# Patient Record
Sex: Male | Born: 1955 | Race: White | Hispanic: No | Smoking: Current every day smoker
Health system: Southern US, Community
[De-identification: ages and names within clinical notes are randomized; demographics above are authoritative.]

## PROBLEM LIST (undated history)

## (undated) DIAGNOSIS — E785 Hyperlipidemia, unspecified: Secondary | ICD-10-CM

## (undated) DIAGNOSIS — K219 Gastro-esophageal reflux disease without esophagitis: Secondary | ICD-10-CM

## (undated) DIAGNOSIS — M1990 Unspecified osteoarthritis, unspecified site: Secondary | ICD-10-CM

## (undated) DIAGNOSIS — Z720 Tobacco use: Secondary | ICD-10-CM

## (undated) DIAGNOSIS — E669 Obesity, unspecified: Secondary | ICD-10-CM

## (undated) DIAGNOSIS — N529 Male erectile dysfunction, unspecified: Secondary | ICD-10-CM

## (undated) HISTORY — DX: Obesity, unspecified: E66.9

## (undated) HISTORY — DX: Hyperlipidemia, unspecified: E78.5

## (undated) HISTORY — DX: Male erectile dysfunction, unspecified: N52.9

## (undated) HISTORY — DX: Tobacco use: Z72.0

---

## 2007-06-21 HISTORY — PX: CHOLECYSTECTOMY: SHX55

## 2007-07-10 ENCOUNTER — Encounter: Admission: RE | Admit: 2007-07-10 | Discharge: 2007-07-10 | Payer: Self-pay | Admitting: Family Medicine

## 2007-08-10 ENCOUNTER — Encounter (INDEPENDENT_AMBULATORY_CARE_PROVIDER_SITE_OTHER): Payer: Self-pay | Admitting: General Surgery

## 2007-08-10 ENCOUNTER — Ambulatory Visit (HOSPITAL_COMMUNITY): Admission: RE | Admit: 2007-08-10 | Discharge: 2007-08-10 | Payer: Self-pay | Admitting: General Surgery

## 2008-03-27 ENCOUNTER — Encounter: Payer: Self-pay | Admitting: Family Medicine

## 2008-12-31 ENCOUNTER — Telehealth: Payer: Self-pay | Admitting: Family Medicine

## 2009-01-08 ENCOUNTER — Ambulatory Visit: Payer: Self-pay | Admitting: Family Medicine

## 2009-01-08 DIAGNOSIS — G4700 Insomnia, unspecified: Secondary | ICD-10-CM | POA: Insufficient documentation

## 2009-02-26 ENCOUNTER — Telehealth: Payer: Self-pay | Admitting: Family Medicine

## 2009-07-05 IMAGING — US US ABDOMEN COMPLETE
1 series · 14 of 25 positions shown · non-contrast
Comparison: none

CLINICAL DATA: Weight loss, abdominal pain. 
 COMPLETE ABDOMINAL ULTRASOUND:
TECHNIQUE: Complete abdominal ultrasound examination was performed including evaluation of the liver, gallbladder, bile ducts, pancreas, kidneys, spleen, IVC, and abdominal aorta.

[Series 1: us abdomen complete · 0.35mm/px · 14 of 71 slices shown]
[im 1/71]
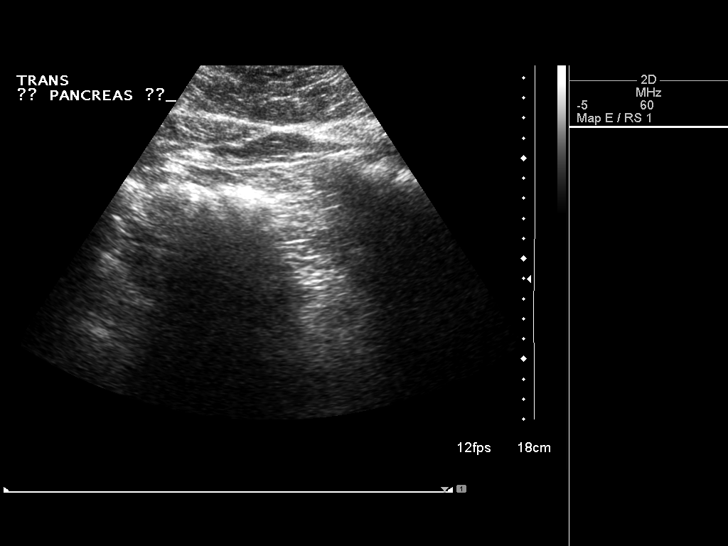
[im 6/71]
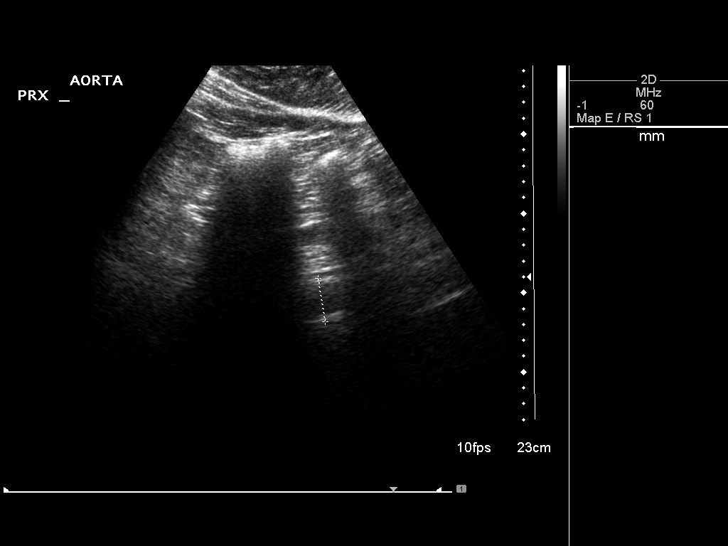
[im 12/71]
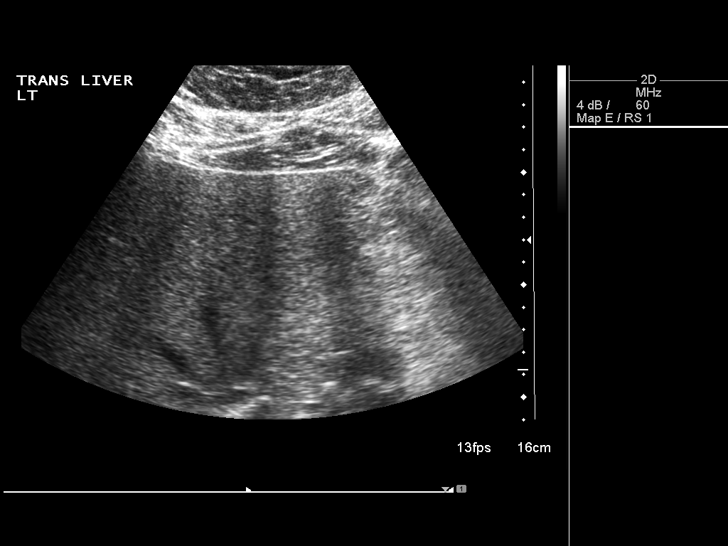
[im 18/71]
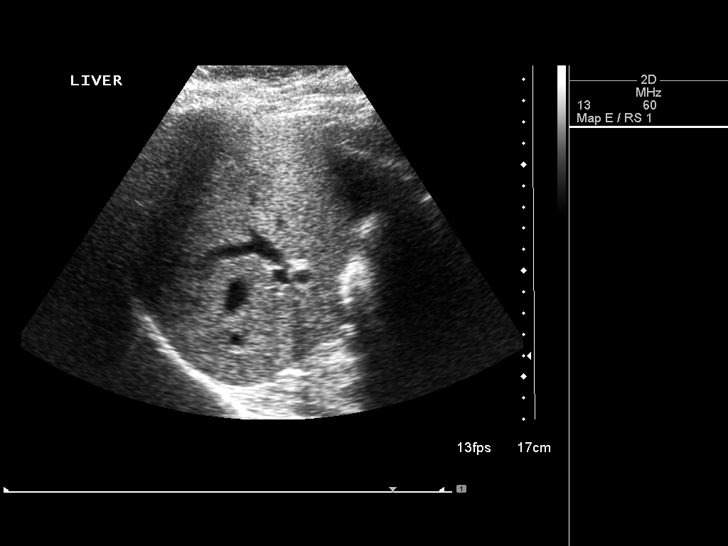
[im 24/71]
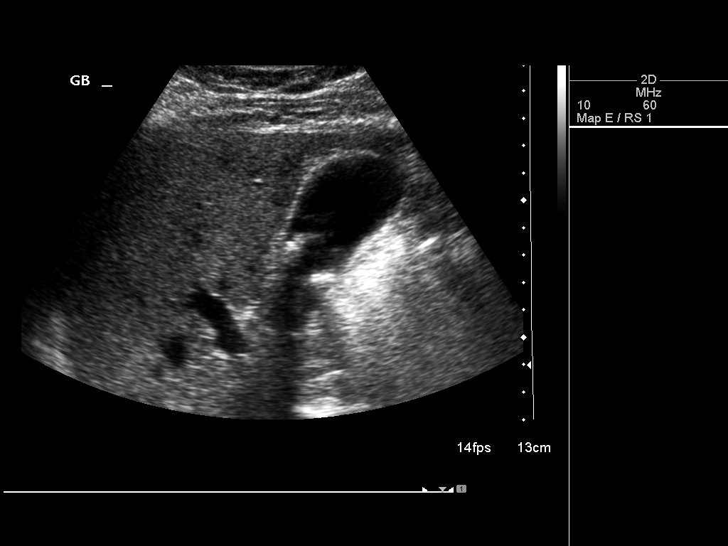
[im 27/71]
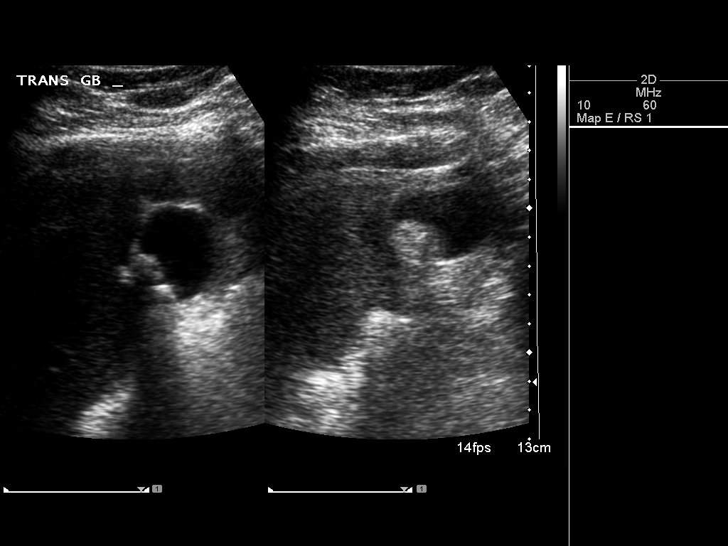
[im 33/71]
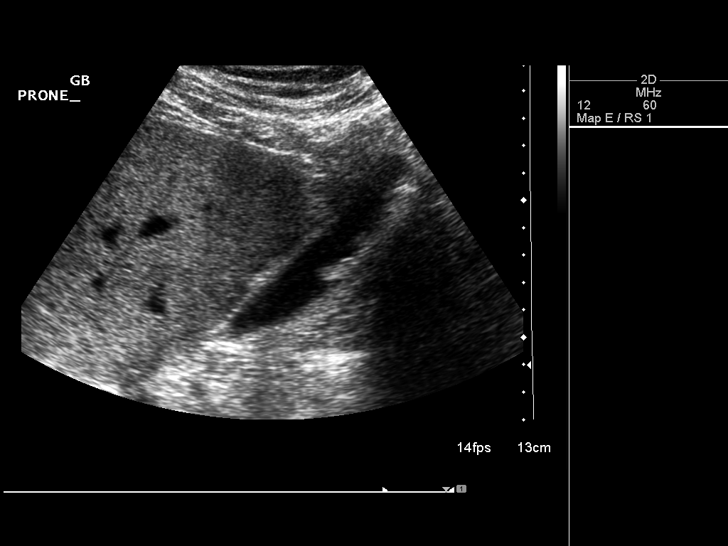
[im 38/71]
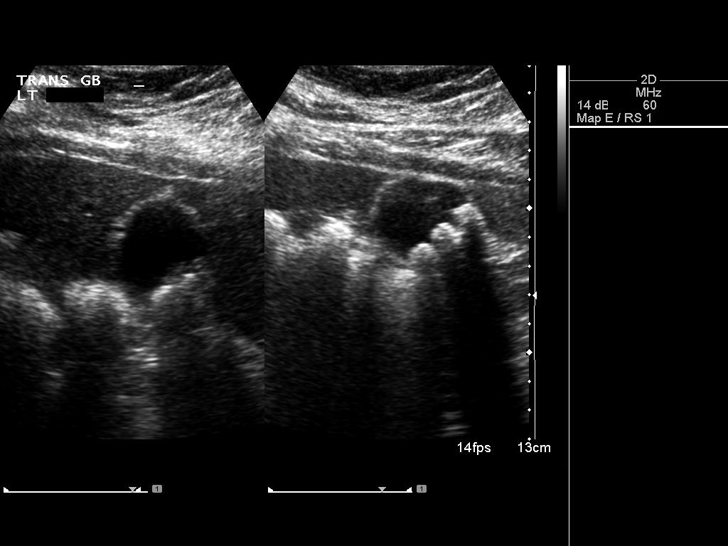
[im 44/71]
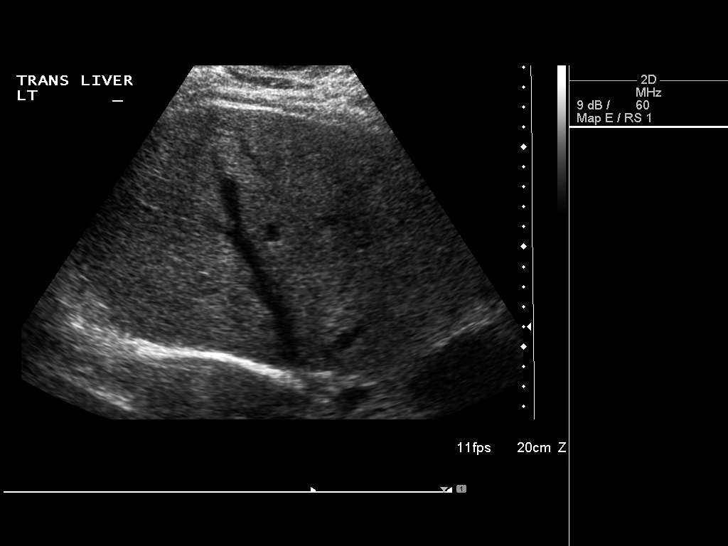
[im 47/71]
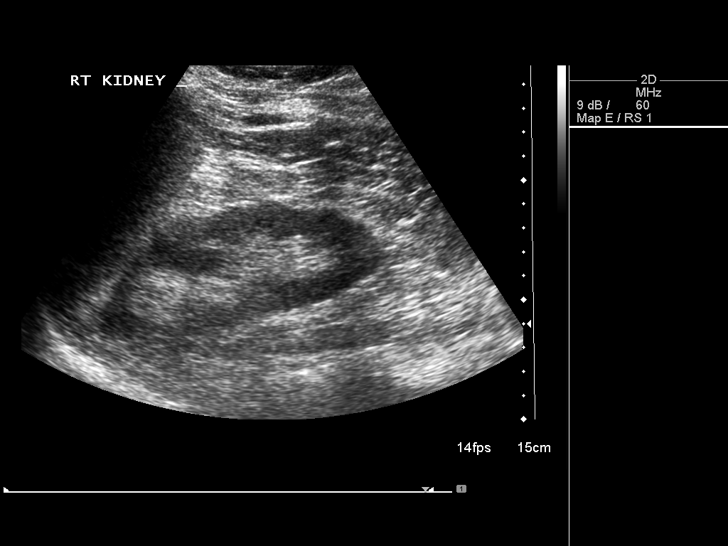
[im 53/71]
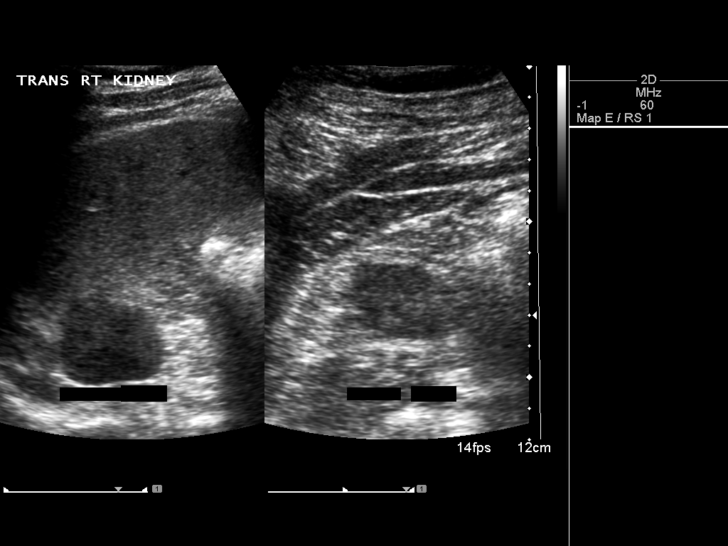
[im 59/71]
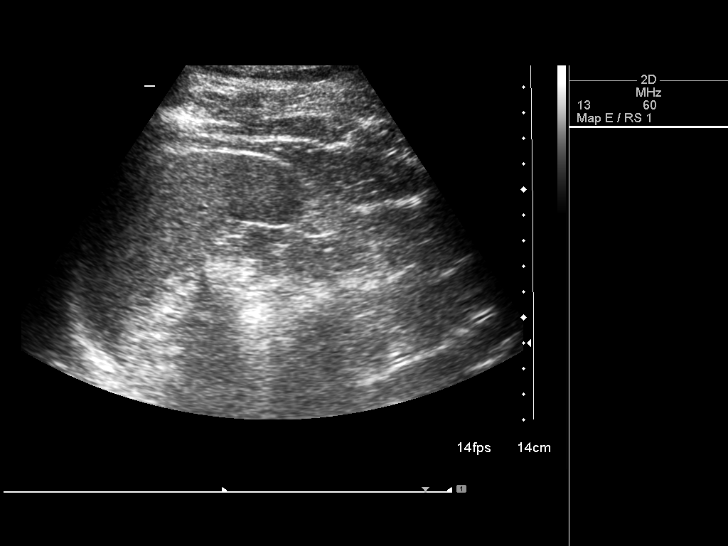
[im 65/71]
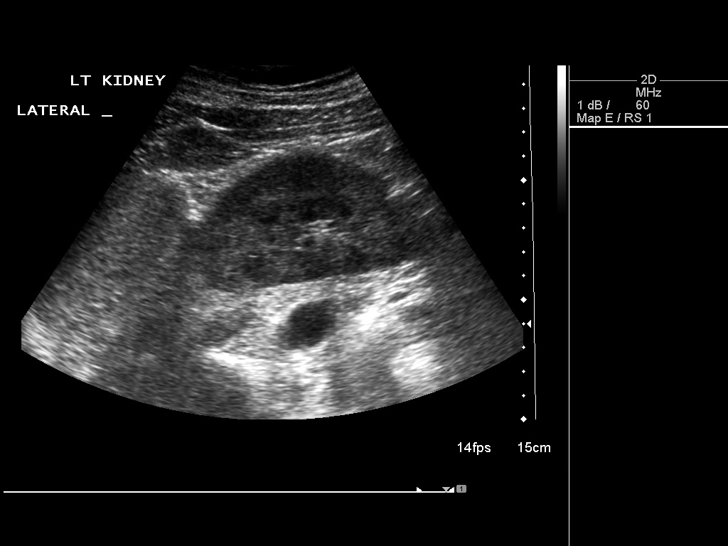
[im 71/71]
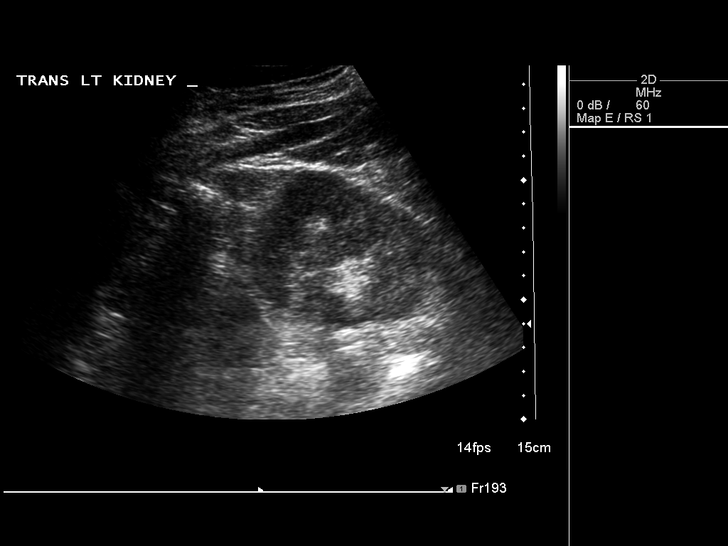

[14 of 25 positions shown; findings below may reference images not displayed]

FINDINGS: Multiple echo densities are present within the gallbladder with shadowing consistent with multiple mobile gallstones.  The liver is slightly echogenic suggesting fatty infiltration.  The common bile duct is normal measuring 3.6mm.  IVC and pancreas are largely obscured by bowel gas.  The spleen is normal in size.  No hydronephrosis is noted.  The right kidney measures 12.1cm sagittally with the left kidney measuring 11.5cm.  The abdominal aorta is normal in caliber.
IMPRESSION: 1.  Multiple gallstones.  No gallbladder wall thickening or ductal dilatation.  
 2.  Fatty infiltration of the liver.  
 3.  Pancreas obscured by bowel gas.

## 2009-08-05 IMAGING — RF DG CHOLANGIOGRAM OPERATIVE
1 series · 4 of 4 positions shown · non-contrast
Comparison: None.

CLINICAL DATA: Cholecystectomy.
 INTRAOPERATIVE CHOLANGIOGRAM:

[Series 1: run · 4 of 76 frames shown]
[frame 12/76]
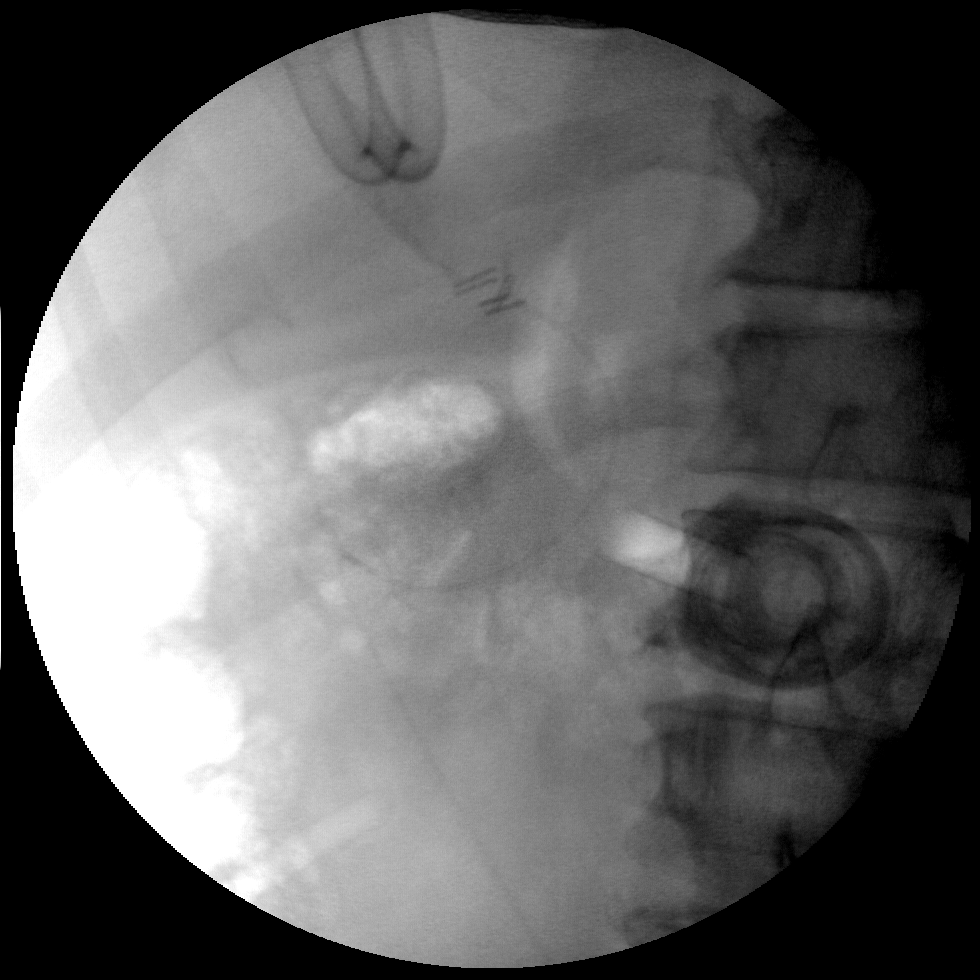
[frame 16/76]
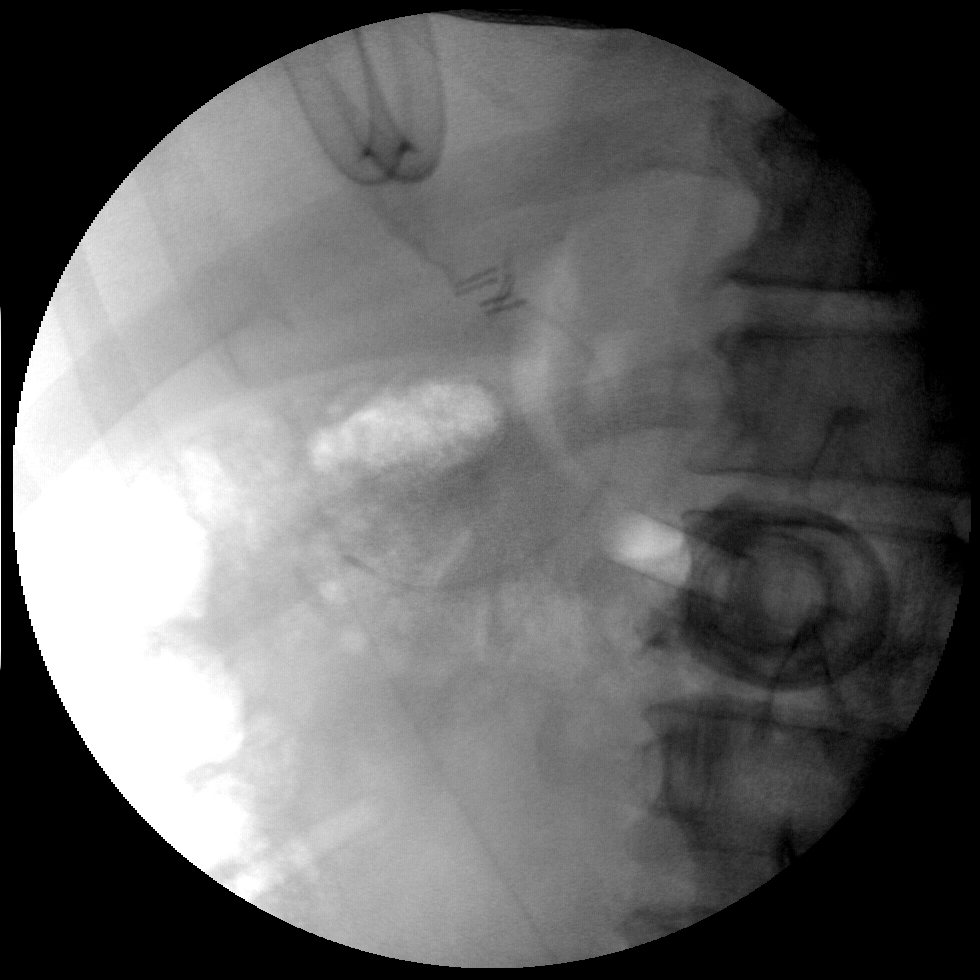
[frame 39/76]
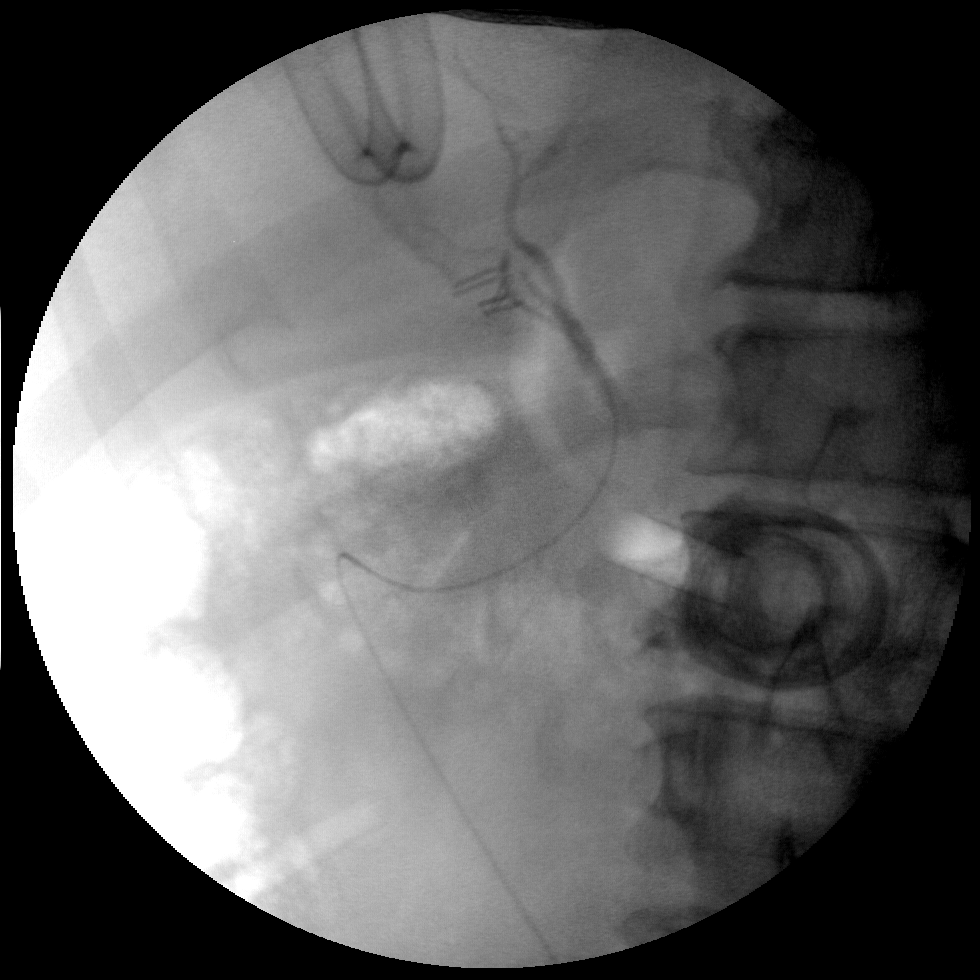
[frame 65/76]
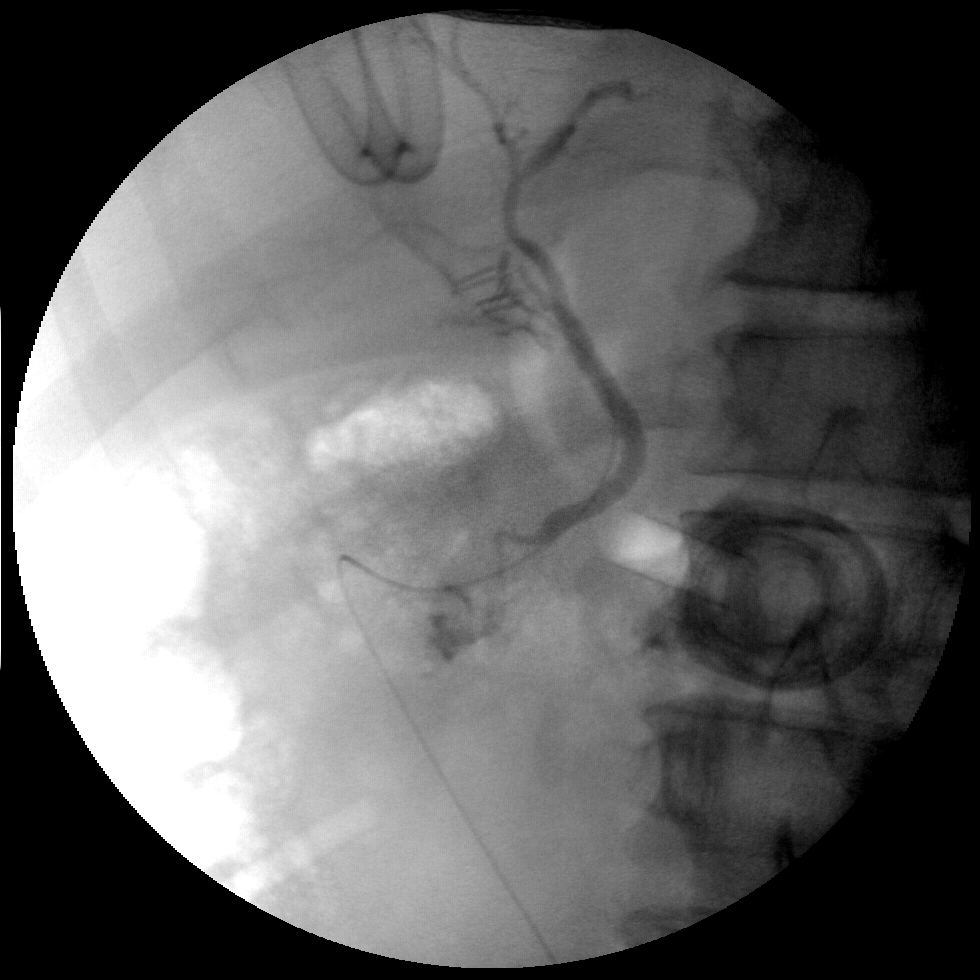

[4 of 4 positions shown; findings below may reference images not displayed]

FINDINGS: We are provided with a series of fluoroscopic intraoperative spot views from cholangiogram.  Clips are present in the region of the cystic duct.  Injection of the cystic duct demonstrates normal caliber common bile duct and visualized hepatic ducts.  No filling defect or stricture is identified.  Contrast flows into the duodenum.  No leak.
IMPRESSION: Normal intraoperative cholangiogram.

## 2009-08-21 ENCOUNTER — Ambulatory Visit: Payer: Self-pay | Admitting: Family Medicine

## 2009-08-21 DIAGNOSIS — H6690 Otitis media, unspecified, unspecified ear: Secondary | ICD-10-CM | POA: Insufficient documentation

## 2009-08-21 DIAGNOSIS — J019 Acute sinusitis, unspecified: Secondary | ICD-10-CM | POA: Insufficient documentation

## 2009-08-24 ENCOUNTER — Ambulatory Visit: Payer: Self-pay | Admitting: Family Medicine

## 2009-09-22 ENCOUNTER — Ambulatory Visit: Payer: Self-pay | Admitting: Family Medicine

## 2009-09-22 DIAGNOSIS — S93409A Sprain of unspecified ligament of unspecified ankle, initial encounter: Secondary | ICD-10-CM | POA: Insufficient documentation

## 2010-07-11 ENCOUNTER — Encounter: Payer: Self-pay | Admitting: General Surgery

## 2010-07-20 NOTE — Assessment & Plan Note (Signed)
Summary: ? virus//ccm   Vital Signs:  Patient profile:   55 year old male Temp:     98.8 degrees F BP sitting:   130 / 84  History of Present Illness: Acute visit. Recent otitis media and acute sinusitis. Start amoxicillin last week. Tolerating well. Now has some nausea and vomiting the past few days. No diarrhea. Denies any abdominal pain or fever. Still has some sinus congestive symptoms and intermittent headache. poor appetite.  Allergies: No Known Drug Allergies  Past History:  Past Medical History: Last updated: 01/08/2009 Obesity Dyslipidemia Ongoing nicotine use Erectile dysfunction PMH reviewed for relevance  Review of Systems      See HPI  Physical Exam  General:  Well-developed,well-nourished,in no acute distress; alert,appropriate and cooperative throughout examination Ears:  right eardrum reveals minimal erythema but overall this looks significantly improved compared to last week. No bulging of eardrums. Ear canals normal Nose:  External nasal examination shows no deformity or inflammation. Nasal mucosa are pink and moist without lesions or exudates. Mouth:  Oral mucosa and oropharynx without lesions or exudates.  Teeth in good repair. Neck:  No deformities, masses, or tenderness noted. Lungs:  Normal respiratory effort, chest expands symmetrically. Lungs are clear to auscultation, no crackles or wheezes. Heart:  Normal rate and regular rhythm. S1 and S2 normal without gallop, murmur, click, rub or other extra sounds. Abdomen:  Bowel sounds positive,abdomen soft and non-tender without masses, organomegaly or hernias noted.   Impression & Recommendations:  Problem # 1:  NAUSEA AND VOMITING (ICD-787.01) ?sinus related.  Doubt sec to antibiotic.  Promethazine prescribed to use as needed.  Problem # 2:  OTITIS MEDIA, ACUTE (ICD-382.9) Assessment: Improved  His updated medication list for this problem includes:    Amoxicillin 875 Mg Tabs (Amoxicillin) ..... One  by mouth two times a day for 10 days  Complete Medication List: 1)  Viagra 100 Mg Tabs (Sildenafil citrate) .... 1/2 to 1 tab by mouth as needed 2)  Amoxicillin 875 Mg Tabs (Amoxicillin) .... One by mouth two times a day for 10 days 3)  Alprazolam 0.5 Mg Tabs (Alprazolam) .... One by mouth at bedtime as needed insomnia 4)  Promethazine Hcl 25 Mg Tabs (Promethazine hcl) .... One by mouth q 6 hours as needed nausea and vomiting.  Patient Instructions: 1)  Touch base by end of week if sinus symptoms are not improving. Prescriptions: PROMETHAZINE HCL 25 MG TABS (PROMETHAZINE HCL) one by mouth q 6 hours as needed nausea and vomiting.  #8 x 0   Entered and Authorized by:   Evelena Peat MD   Signed by:   Evelena Peat MD on 08/24/2009   Method used:   [facility]        [facility]  [place] [address]       [address]       [place], [place]  [address]       Ph: [phone] or [phone]       Fax: [phone]   RxID:   [id number]

## 2010-07-20 NOTE — Assessment & Plan Note (Signed)
Summary: leg swelling/njr   Vital Signs:  Patient profile:   55 year old male Weight:      250 pounds Temp:     98 degrees F oral BP sitting:   130 / 92  (left arm) Cuff size:   large  Vitals Entered By: Sid Falcon LPN (September 23, 5407 9:50 AM) CC: left ankle injury 10 days ago   History of Present Illness: Patient seen with left ankle injury which occurred about one half weeks ago. Stepped on some rocks coming off stairs and rolled his ankle with presumed inversion type injury. Immediate swelling. Difficult initially bearing weight. Soreness is resolving over time. Improvement with Aleve and icing. Still has persistent edema. Still some pain with prolonged periods of standing. Is using a brace and Ace wrap intermittently  Allergies (verified): No Known Drug Allergies  Past History:  Past Medical History: Last updated: 01/08/2009 Obesity Dyslipidemia Ongoing nicotine use Erectile dysfunction PMH reviewed for relevance  Physical Exam  General:  Well-developed,well-nourished,in no acute distress; alert,appropriate and cooperative throughout examination Mouth:  no dental plaque.   Extremities:  left ankle reveals effusion and swelling predominantly lateral ankle and anterior with minimal edema medially. No visible ecchymosis. No significant localized bony tenderness. Limited eversion and inversion secondary to edema. Achilles Tendon is intact and nontender. Good strength with plantar flexion /dorsiflexion.   Impression & Recommendations:  Problem # 1:  ANKLE SPRAIN (ICD-845.00) given persistent edema X-ray to further evaluate. Orders: T-Ankle Comp Left Min 3 Views (73610TC)  Complete Medication List: 1)  Viagra 100 Mg Tabs (Sildenafil citrate) .... 1/2 to 1 tab by mouth as needed 2)  Alprazolam 0.5 Mg Tabs (Alprazolam) .... One by mouth at bedtime as needed insomnia 3)  Promethazine Hcl 25 Mg Tabs (Promethazine hcl) .... One by mouth q 6 hours as needed nausea and  vomiting.  Patient Instructions: 1)  Continue frequent elevation, icing, Ace wrap, and anti-inflammatory such as ibuprofen or Aleve

## 2010-07-20 NOTE — Assessment & Plan Note (Signed)
Summary: sinuses//ccm   Vital Signs:  Patient profile:   55 year old male Temp:     99.0 degrees F oral BP sitting:   130 / 88  (left arm) Cuff size:   large  Vitals Entered By: Sid Falcon LPN (August 21, 1608 8:54 AM) CC: Sinusus, right ear pain   History of Present Illness: Acute visit. URI symptoms last week. Now has right ear pain past couple days. Headaches off and on. No fever. Right maxillary facial pain. Occasional yellowish nasal discharge. No known drug allergies.  Patient also complaining of some chronic intermittent insomnia issues. Has previously used very low dose alprazolam which seemed to help. Sleep hygiene issues discussed.  Allergies (verified): No Known Drug Allergies  Past History:  Past Medical History: Last updated: 01/08/2009 Obesity Dyslipidemia Ongoing nicotine use Erectile dysfunction  Past Surgical History: Last updated: 01/08/2009 Cholecystectomy 2009  Social History: Last updated: 01/08/2009 Occupation:  Art gallery manager Divorced Current Smoker Alcohol use-yes Regular exercise-no PMH-FH-SH reviewed for relevance  Review of Systems      See HPI  Physical Exam  Ears:  right tympanic membrane bulging with distorted landmarks. Left TM is normal Nose:  yellow thick crusted drainage right naris Mouth:  Oral mucosa and oropharynx without lesions or exudates.  Teeth in good repair. Neck:  No deformities, masses, or tenderness noted. Lungs:  Normal respiratory effort, chest expands symmetrically. Lungs are clear to auscultation, no crackles or wheezes. Heart:  Normal rate and regular rhythm. S1 and S2 normal without gallop, murmur, click, rub or other extra sounds.   Impression & Recommendations:  Problem # 1:  OTITIS MEDIA, ACUTE (ICD-382.9)  His updated medication list for this problem includes:    Amoxicillin 875 Mg Tabs (Amoxicillin) ..... One by mouth two times a day for 10 days  Problem # 2:  INSOMNIA, TRANSIENT  (ICD-780.52) prescribed low-dose alprazolam to use rarely as needed  Complete Medication List: 1)  Viagra 100 Mg Tabs (Sildenafil citrate) .... 1/2 to 1 tab by mouth as needed 2)  Amoxicillin 875 Mg Tabs (Amoxicillin) .... One by mouth two times a day for 10 days 3)  Alprazolam 0.5 Mg Tabs (Alprazolam) .... One by mouth at bedtime as needed insomnia  Patient Instructions: 1)  call or touch base next week if symptoms not improving. Prescriptions: ALPRAZOLAM 0.5 MG TABS (ALPRAZOLAM) one by mouth at bedtime as needed insomnia  #30 x 1   Entered and Authorized by:   Evelena Peat MD   Signed by:   Evelena Peat MD on 08/21/2009   Method used:   Print then Give to Patient   RxID:   [id number] AMOXICILLIN 875 MG TABS (AMOXICILLIN) one by mouth two times a day for 10 days  #20 x 0   Entered and Authorized by:   Evelena Peat MD   Signed by:   Evelena Peat MD on 08/21/2009   Method used:   [facility]        [facility]  [place] [address] [facility]       [address]       [place], [place]  [address]       Ph: [phone] or [phone]       Fax: [phone]   RxID:   [id number]

## 2010-08-09 ENCOUNTER — Other Ambulatory Visit: Payer: Self-pay | Admitting: *Deleted

## 2010-08-09 DIAGNOSIS — G4700 Insomnia, unspecified: Secondary | ICD-10-CM

## 2010-08-09 MED ORDER — ALPRAZOLAM 0.5 MG PO TABS: 0.5000 mg | ORAL_TABLET | Freq: Three times a day (TID) | ORAL | Status: DC | PRN

## 2010-08-09 NOTE — Telephone Encounter (Signed)
OK to refill once.   

## 2010-08-09 NOTE — Telephone Encounter (Signed)
Pharmacy requesting refill of Alprazolam.  Last filled 05-07-10 # 30 with 0 refills.  Take one tab at bedtime as needed for insomnia

## 2010-11-02 NOTE — Op Note (Signed)
NAMEALLEN, Keith Neal                 ACCOUNT NO.:  [id number]   MEDICAL RECORD NO.:  [id number]          PATIENT TYPE:  AMB   LOCATION:  DAY                          FACILITY:  [facility]   PHYSICIAN:  Adolph Pollack, M.D.DATE OF BIRTH:  May 09, 1956   DATE OF PROCEDURE:  08/10/2007  DATE OF DISCHARGE:                               OPERATIVE REPORT   PREOPERATIVE DIAGNOSIS:  Symptomatic cholelithiasis.   POSTOPERATIVE DIAGNOSIS:  Symptomatic cholelithiasis.   PROCEDURE:  Laparoscopic cholecystectomy with intraoperative  cholangiogram.   SURGEON:  Adolph Pollack, M.D.   ASSISTANT:  Wilmon Arms. Tsuei, M.D.   ANESTHESIA:  General.   INDICATIONS:  This 55 year old male has been having epigastric  discomfort and nausea and has known gallstones.  It is felt his symptoms  are consistent with biliary colic potentially.  Liver function tests are  within normal limits.  He is now brought to the operating room for  elective laparoscopic cholecystectomy.  We discussed the procedure risks  and aftercare preoperatively.   TECHNIQUE:  He was seen in the holding area and brought to the operating  room, placed supine on the operating table, and a general anesthetic was  administered.  The abdominal wall was sterilely prepped and draped.  Marcaine was infiltrated in the subumbilical region.  A subumbilical  incision was made through skin, subcutaneous tissue, midline fascia and  peritoneum entering the peritoneal cavity under direct vision.  A  pursestring suture of 0 Vicryl was placed around the fascial edges.  A  Hassan trocar was introduced into the peritoneal cavity and  pneumoperitoneum created by insufflation of CO2 gas.   Next, a laparoscope was introduced.  He was placed in reversed  Trendelenburg position with the right side tilted slightly up.  An 11 mm  trocar was placed through an epigastric incision and two 5 mm trocars  were placed in the right lateral abdomen.  The fundus of the  gallbladder  was grasped and omental adhesions were noted to the body of the  gallbladder.  The fundus was retracted toward the right shoulder.  The  omental adhesions were taken down bluntly.  The infundibulum was grasped  and mobilized using blunt dissection.  I then isolated the cystic duct  and created a window around it.  I also isolated the cystic artery and  created a window around it.  I then placed a clip at the cystic duct  gallbladder junction and made a small incision in the cystic duct.  Bile  was milked back in the cystic duct.  The cholangiocatheter was passed to  the anterior abdominal wall and placed into the cystic duct and a  cholangiogram was performed.   Under real time fluoroscopy, dilute contrast was injected into the  cystic duct which was of moderate length.  The common hepatic, right and  left hepatic, common bile ducts all filled promptly and contrast drained  into the duodenum promptly without obvious evidence of obstruction.  The  final report is pending the radiologist's interpretation.   The cholangiocatheter was removed, the cystic duct was then clipped  three times proximally and divided.  I then clipped the cystic artery  and divided it.  The gallbladder was dissected free from the liver using  electrocautery and placed in an Endopouch bag.  The gallbladder fossa  was irrigated.  Bleeding points were controlled with cautery.  It was  irrigated and inspected again and there was no bleeding or bile leak.  Irrigation fluid was evacuated.  The gallbladder was then removed in the  endobag through the subumbilical port.  The subumbilical fascial defect  was closed under laparoscopic vision by tightening up and tying down the  pursestring suture.  The remaining trocars were removed and  pneumoperitoneum was released.   The skin incisions were then closed with 4-0 Monocryl subcuticular  stitches followed by Steri-Strips and sterile dressings.  He tolerated   the procedure without apparent complications and was taken to recovery  in satisfactory condition.      Adolph Pollack, M.D.  Electronically Signed     TJR/MEDQ  D:  08/10/2007  T:  08/10/2007  Job:  [id number]   cc:   Evelena Peat, M.D.

## 2011-01-13 ENCOUNTER — Telehealth: Payer: Self-pay | Admitting: *Deleted

## 2011-01-13 DIAGNOSIS — G4700 Insomnia, unspecified: Secondary | ICD-10-CM

## 2011-01-13 NOTE — Telephone Encounter (Signed)
Faxed refill for Alprazolam 0.5.  Last filled 08/09/10 #30 with 0 refills Last OV 09-22-09, sore throat

## 2011-01-13 NOTE — Telephone Encounter (Signed)
Refill once OK. 

## 2011-01-14 MED ORDER — ALPRAZOLAM 0.5 MG PO TABS: 0.5000 mg | ORAL_TABLET | Freq: Three times a day (TID) | ORAL | Status: DC | PRN

## 2011-03-08 ENCOUNTER — Other Ambulatory Visit: Payer: Self-pay | Admitting: *Deleted

## 2011-03-08 DIAGNOSIS — G4700 Insomnia, unspecified: Secondary | ICD-10-CM

## 2011-03-08 NOTE — Telephone Encounter (Signed)
Alprazolam 0.5 refill request.  Last filled 7/27, #30 with 0 refills

## 2011-03-08 NOTE — Telephone Encounter (Signed)
Refill once.  Should not be taking regularly.  If significant anxiety symptoms ongoing recommend office follow up to discuss options.

## 2011-03-09 MED ORDER — ALPRAZOLAM 0.5 MG PO TABS: 0.5000 mg | ORAL_TABLET | Freq: Three times a day (TID) | ORAL | Status: DC | PRN

## 2011-03-09 NOTE — Telephone Encounter (Signed)
Pt given message on cell VM

## 2011-03-11 LAB — DIFFERENTIAL
Basophils Absolute: 0
Basophils Relative: 1
Eosinophils Absolute: 0.3
Eosinophils Relative: 3
Lymphocytes Relative: 39
Lymphs Abs: 3.1
Monocytes Absolute: 0.8
Monocytes Relative: 10
Neutro Abs: 3.8
Neutrophils Relative %: 47

## 2011-03-11 LAB — CBC
HCT: 42.2
Hemoglobin: 14.9
MCHC: 35.3
MCV: 91.5
Platelets: 263
RBC: 4.61
RDW: 12.6
WBC: 8

## 2011-03-11 LAB — COMPREHENSIVE METABOLIC PANEL
ALT: 20
AST: 17
Albumin: 4.1
Alkaline Phosphatase: 52
BUN: 11
CO2: 27
Calcium: 9.7
Chloride: 104
Creatinine, Ser: 1.03
GFR calc Af Amer: >60
GFR calc non Af Amer: >60
Glucose, Bld: 96
Potassium: 4.1
Sodium: 138
Total Bilirubin: 0.9
Total Protein: 7.5

## 2011-03-11 LAB — URINALYSIS, ROUTINE W REFLEX MICROSCOPIC
Bilirubin Urine: NEGATIVE
Glucose, UA: NEGATIVE
Hgb urine dipstick: NEGATIVE
Specific Gravity, Urine: 1.004 — ABNORMAL LOW
Urobilinogen, UA: 0.2

## 2011-03-11 LAB — PROTIME-INR: Prothrombin Time: 12.9

## 2011-04-28 ENCOUNTER — Encounter: Payer: Self-pay | Admitting: Family Medicine

## 2011-04-28 ENCOUNTER — Ambulatory Visit (INDEPENDENT_AMBULATORY_CARE_PROVIDER_SITE_OTHER): Payer: BC Managed Care – PPO | Admitting: Family Medicine

## 2011-04-28 VITALS — BP 130/82 | Temp 98.3°F | Wt 249.0 lb

## 2011-04-28 DIAGNOSIS — N529 Male erectile dysfunction, unspecified: Secondary | ICD-10-CM

## 2011-04-28 DIAGNOSIS — G4700 Insomnia, unspecified: Secondary | ICD-10-CM

## 2011-04-28 MED ORDER — ALPRAZOLAM 0.5 MG PO TABS: 0.5000 mg | ORAL_TABLET | Freq: Three times a day (TID) | ORAL | Status: AC | PRN
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
## 2011-04-28 NOTE — Patient Instructions (Addendum)
Consider complete physical at some point later this year. Cialis 5 mg may take one daily as needed.

## 2011-05-30 ENCOUNTER — Telehealth: Payer: Self-pay | Admitting: Family Medicine

## 2011-05-30 NOTE — Telephone Encounter (Signed)
Pt requesting refill on sildenafil (VIAGRA) 100 MG tablet

## 2011-06-09 ENCOUNTER — Telehealth: Payer: Self-pay | Admitting: Family Medicine

## 2011-06-09 MED ORDER — SILDENAFIL CITRATE 100 MG PO TABS: 100.0000 mg | ORAL_TABLET | Freq: Every day | ORAL | Status: DC | PRN

## 2011-06-09 NOTE — Telephone Encounter (Signed)
Refill Viagra 100mg  to [facility]. Thanks.

## 2011-06-30 ENCOUNTER — Other Ambulatory Visit: Payer: Self-pay | Admitting: Family Medicine

## 2011-07-14 ENCOUNTER — Telehealth: Payer: Self-pay | Admitting: Family Medicine

## 2011-07-14 NOTE — Telephone Encounter (Signed)
Refill Viagra 100mg  [facility]. Patient knows that it is too soon, but he stated that he is out of meds. He also stated that he does not care if his insurance will cover it or not, if it is too soon. Thanks.

## 2011-07-15 MED ORDER — SILDENAFIL CITRATE 100 MG PO TABS: 100.0000 mg | ORAL_TABLET | ORAL | Status: DC | PRN

## 2011-07-15 NOTE — Telephone Encounter (Signed)
Refill for one year 

## 2011-07-22 ENCOUNTER — Telehealth: Payer: Self-pay | Admitting: Family Medicine

## 2011-07-22 MED ORDER — SILDENAFIL CITRATE 100 MG PO TABS: 100.0000 mg | ORAL_TABLET | ORAL | Status: DC | PRN

## 2011-07-22 NOTE — Telephone Encounter (Signed)
Refill Viagra 100mg  [facility]. (self pay) thanks

## 2011-11-02 ENCOUNTER — Other Ambulatory Visit (INDEPENDENT_AMBULATORY_CARE_PROVIDER_SITE_OTHER): Payer: BC Managed Care – PPO

## 2011-11-02 DIAGNOSIS — Z0000 Encounter for general adult medical examination without abnormal findings: Secondary | ICD-10-CM

## 2011-11-02 LAB — POCT URINALYSIS DIPSTICK
Glucose, UA: NEGATIVE
Nitrite, UA: NEGATIVE
Protein, UA: NEGATIVE
Urobilinogen, UA: 0.2

## 2011-11-02 LAB — CBC WITH DIFFERENTIAL/PLATELET
Basophils Absolute: 0.1 10*3/uL (ref 0.0–0.1)
Eosinophils Absolute: 0.2 10*3/uL (ref 0.0–0.7)
HCT: 44 % (ref 39.0–52.0)
Lymphs Abs: 2.9 10*3/uL (ref 0.7–4.0)
MCHC: 33.6 g/dL (ref 30.0–36.0)
Monocytes Absolute: 0.9 10*3/uL (ref 0.1–1.0)
Monocytes Relative: 10.1 % (ref 3.0–12.0)
Platelets: 314 10*3/uL (ref 150.0–400.0)
RDW: 13.6 % (ref 11.5–14.6)

## 2011-11-02 LAB — TSH: TSH: 1.14 u[IU]/mL (ref 0.35–5.50)

## 2011-11-02 LAB — LIPID PANEL
LDL Cholesterol: 120 mg/dL — ABNORMAL HIGH (ref 0–99)
VLDL: 16 mg/dL (ref 0.0–40.0)

## 2011-11-02 LAB — HEPATIC FUNCTION PANEL
AST: 16 U/L (ref 0–37)
Total Bilirubin: 0.6 mg/dL (ref 0.3–1.2)

## 2011-11-02 LAB — BASIC METABOLIC PANEL
BUN: 12 mg/dL (ref 6–23)
GFR: 82.48 mL/min (ref >60.00)
Glucose, Bld: 107 mg/dL — ABNORMAL HIGH (ref 70–99)
Potassium: 4.5 mEq/L (ref 3.5–5.1)

## 2011-11-02 LAB — PSA: PSA: 3.59 ng/mL (ref 0.10–4.00)

## 2011-11-10 ENCOUNTER — Encounter: Payer: Self-pay | Admitting: Family Medicine

## 2011-11-10 ENCOUNTER — Ambulatory Visit (INDEPENDENT_AMBULATORY_CARE_PROVIDER_SITE_OTHER): Payer: BC Managed Care – PPO | Admitting: Family Medicine

## 2011-11-10 VITALS — BP 130/78 | HR 80 | Temp 98.1°F | Resp 12 | Ht 68.0 in | Wt 230.0 lb

## 2011-11-10 DIAGNOSIS — Z0000 Encounter for general adult medical examination without abnormal findings: Secondary | ICD-10-CM

## 2011-11-10 MED ORDER — TADALAFIL 20 MG PO TABS: 20.0000 mg | ORAL_TABLET | Freq: Every day | ORAL | Status: DC | PRN

## 2011-11-10 NOTE — Patient Instructions (Signed)
Smoking Cessation This document explains the best ways for you to quit smoking and new treatments to help. It lists new medicines that can double or triple your chances of quitting and quitting for good. It also considers ways to avoid relapses and concerns you may have about quitting, including weight gain. NICOTINE: A POWERFUL ADDICTION If you have tried to quit smoking, you know how hard it can be. It is hard because nicotine is a very addictive drug. For some people, it can be as addictive as heroin or cocaine. Usually, people make 2 or 3 tries, or more, before finally being able to quit. Each time you try to quit, you can learn about what helps and what hurts. Quitting takes hard work and a lot of effort, but you can quit smoking. QUITTING SMOKING IS ONE OF THE MOST IMPORTANT THINGS YOU WILL EVER DO.  You will live longer, feel better, and live better.   The impact on your body of quitting smoking is felt almost immediately:   Within 20 minutes, blood pressure decreases. Pulse returns to its normal level.   After 8 hours, carbon monoxide levels in the blood return to normal. Oxygen level increases.   After 24 hours, chance of heart attack starts to decrease. Breath, hair, and body stop smelling like smoke.   After 48 hours, damaged nerve endings begin to recover. Sense of taste and smell improve.   After 72 hours, the body is virtually free of nicotine. Bronchial tubes relax and breathing becomes easier.   After 2 to 12 weeks, lungs can hold more air. Exercise becomes easier and circulation improves.   Quitting will reduce your risk of having a heart attack, stroke, cancer, or lung disease:   After 1 year, the risk of coronary heart disease is cut in half.   After 5 years, the risk of stroke falls to the same as a nonsmoker.   After 10 years, the risk of lung cancer is cut in half and the risk of other cancers decreases significantly.   After 15 years, the risk of coronary heart  disease drops, usually to the level of a nonsmoker.   If you are pregnant, quitting smoking will improve your chances of having a healthy baby.   The people you live with, especially your children, will be healthier.   You will have extra money to spend on things other than cigarettes.  FIVE KEYS TO QUITTING Studies have shown that these 5 steps will help you quit smoking and quit for good. You have the best chances of quitting if you use them together: 1. Get ready.  2. Get support and encouragement.  3. Learn new skills and behaviors.  4. Get medicine to reduce your nicotine addiction and use it correctly.  5. Be prepared for relapse or difficult situations. Be determined to continue trying to quit, even if you do not succeed at first.  1. GET READY  Set a quit date.   Change your environment.   Get rid of ALL cigarettes, ashtrays, matches, and lighters in your home, car, and place of work.   Do not let people smoke in your home.   Review your past attempts to quit. Think about what worked and what did not.   Once you quit, do not smoke. NOT EVEN A PUFF!  2. GET SUPPORT AND ENCOURAGEMENT Studies have shown that you have a better chance of being successful if you have help. You can get support in many ways.  Tell   your family, friends, and coworkers that you are going to quit and need their support. Ask them not to smoke around you.   Talk to your caregivers (doctor, dentist, nurse, pharmacist, psychologist, and/or smoking counselor).   Get individual, group, or telephone counseling and support. The more counseling you have, the better your chances are of quitting. Programs are available at local hospitals and health centers. Call your local health department for information about programs in your area.   Spiritual beliefs and practices may help some smokers quit.   Quit meters are small computer programs online or downloadable that keep track of quit statistics, such as amount  of "quit-time," cigarettes not smoked, and money saved.   Many smokers find one or more of the many self-help books available useful in helping them quit and stay off tobacco.  3. LEARN NEW SKILLS AND BEHAVIORS  Try to distract yourself from urges to smoke. Talk to someone, go for a walk, or occupy your time with a task.   When you first try to quit, change your routine. Take a different route to work. Drink tea instead of coffee. Eat breakfast in a different place.   Do something to reduce your stress. Take a hot bath, exercise, or read a book.   Plan something enjoyable to do every day. Reward yourself for not smoking.   Explore interactive web-based programs that specialize in helping you quit.  4. GET MEDICINE AND USE IT CORRECTLY Medicines can help you stop smoking and decrease the urge to smoke. Combining medicine with the above behavioral methods and support can quadruple your chances of successfully quitting smoking. The [place] Food and Drug Administration (FDA) has approved 7 medicines to help you quit smoking. These medicines fall into 3 categories.  Nicotine replacement therapy (delivers nicotine to your body without the negative effects and risks of smoking):   Nicotine gum: Available over-the-counter.   Nicotine lozenges: Available over-the-counter.   Nicotine inhaler: Available by prescription.   Nicotine nasal spray: Available by prescription.   Nicotine skin patches (transdermal): Available by prescription and over-the-counter.   Antidepressant medicine (helps people abstain from smoking, but how this works is unknown):   Bupropion sustained-release (SR) tablets: Available by prescription.   Nicotinic receptor partial agonist (simulates the effect of nicotine in your brain):   Varenicline tartrate tablets: Available by prescription.   Ask your caregiver for advice about which medicines to use and how to use them. Carefully read the information on the package.    Everyone who is trying to quit may benefit from using a medicine. If you are pregnant or trying to become pregnant, nursing an infant, you are under age 18, or you smoke fewer than 10 cigarettes per day, talk to your caregiver before taking any nicotine replacement medicines.   You should stop using a nicotine replacement product and call your caregiver if you experience nausea, dizziness, weakness, vomiting, fast or irregular heartbeat, mouth problems with the lozenge or gum, or redness or swelling of the skin around the patch that does not go away.   Do not use any other product containing nicotine while using a nicotine replacement product.   Talk to your caregiver before using these products if you have diabetes, heart disease, asthma, stomach ulcers, you had a recent heart attack, you have high blood pressure that is not controlled with medicine, a history of irregular heartbeat, or you have been prescribed medicine to help you quit smoking.  5. BE PREPARED FOR RELAPSE OR   DIFFICULT SITUATIONS  Most relapses occur within the first 3 months after quitting. Do not be discouraged if you start smoking again. Remember, most people try several times before they finally quit.   You may have symptoms of withdrawal because your body is used to nicotine. You may crave cigarettes, be irritable, feel very hungry, cough often, get headaches, or have difficulty concentrating.   The withdrawal symptoms are only temporary. They are strongest when you first quit, but they will go away within 10 to 14 days.  Here are some difficult situations to watch for:  Alcohol. Avoid drinking alcohol. Drinking lowers your chances of successfully quitting.   Caffeine. Try to reduce the amount of caffeine you consume. It also lowers your chances of successfully quitting.   Other smokers. Being around smoking can make you want to smoke. Avoid smokers.   Weight gain. Many smokers will gain weight when they quit, usually  less than 10 pounds. Eat a healthy diet and stay active. Do not let weight gain distract you from your main goal, quitting smoking. Some medicines that help you quit smoking may also help delay weight gain. You can always lose the weight gained after you quit.   Bad mood or depression. There are a lot of ways to improve your mood other than smoking.  If you are having problems with any of these situations, talk to your caregiver. SPECIAL SITUATIONS AND CONDITIONS Studies suggest that everyone can quit smoking. Your situation or condition can give you a special reason to quit.  Pregnant women/new mothers: By quitting, you protect your baby's health and your own.   Hospitalized patients: By quitting, you reduce health problems and help healing.   Heart attack patients: By quitting, you reduce your risk of a second heart attack.   Lung, head, and neck cancer patients: By quitting, you reduce your chance of a second cancer.   Parents of children and adolescents: By quitting, you protect your children from illnesses caused by secondhand smoke.  QUESTIONS TO THINK ABOUT Think about the following questions before you try to stop smoking. You may want to talk about your answers with your caregiver.  Why do you want to quit?   If you tried to quit in the past, what helped and what did not?   What will be the most difficult situations for you after you quit? How will you plan to handle them?   Who can help you through the tough times? Your family? Friends? Caregiver?   What pleasures do you get from smoking? What ways can you still get pleasure if you quit?  Here are some questions to ask your caregiver:  How can you help me to be successful at quitting?   What medicine do you think would be best for me and how should I take it?   What should I do if I need more help?   What is smoking withdrawal like? How can I get information on withdrawal?  Quitting takes hard work and a lot of effort,  but you can quit smoking. FOR MORE INFORMATION  Smokefree.gov (http://www.davis-sullivan.com/) provides free, accurate, evidence-based information and professional assistance to help support the immediate and long-term needs of people trying to quit smoking. Document Released: 05/31/2001 Document Revised: 05/26/2011 Document Reviewed: 03/23/2009 [facility] Patient Information 2012 [place], [place].  Follow up in 6 months and let's plan to repeat PSA then.

## 2011-11-10 NOTE — Progress Notes (Signed)
Subjective:    Patient ID: Keith Neal, male    DOB: 01/01/1956, 56 y.o.   MRN: [id number]  HPI  Patient several complete physical. No history of screening colonoscopy. He is not sure he wishes to schedule yet. He is still smoking about one pack cigarettes per day. No consistent exercise. He has lost about 19 pounds since last visit due to his efforts. He plans to lose more. He plans to quit smoking with use of nicotine patches.  He had some ongoing problems with erectile dysfunction. His uses Viagra with good success the past. Request trial of Cialis.   Past Medical History  Diagnosis Date  . Obesity   . Dyslipidemia   . Tobacco user   . ED (erectile dysfunction)    Past Surgical History  Procedure Date  . Cholecystectomy     reports that he has been smoking Cigarettes.  He has a 15 pack-year smoking history. He does not have any smokeless tobacco history on file. His alcohol and drug histories not on file. family history includes Arthritis in his other; Cancer in his mother; Diabetes in his father and mother; and Hypertension in his father and mother. No Known Allergies    Review of Systems  Constitutional: Negative for fever, activity change, appetite change and fatigue.  HENT: Negative for ear pain, congestion and trouble swallowing.   Eyes: Negative for pain and visual disturbance.  Respiratory: Negative for cough, shortness of breath and wheezing.   Cardiovascular: Negative for chest pain and palpitations.  Gastrointestinal: Negative for nausea, vomiting, abdominal pain, diarrhea, constipation, blood in stool, abdominal distention and rectal pain.  Genitourinary: Negative for dysuria, hematuria and testicular pain.  Musculoskeletal: Negative for joint swelling and arthralgias.  Skin: Negative for rash.  Neurological: Negative for dizziness, syncope and headaches.  Hematological: Negative for adenopathy.  Psychiatric/Behavioral: Negative for confusion and dysphoric  mood.       Objective:   Physical Exam  Constitutional: He is oriented to person, place, and time. He appears well-developed and well-nourished. No distress.  HENT:  Head: Normocephalic and atraumatic.  Right Ear: External ear normal.  Left Ear: External ear normal.  Mouth/Throat: Oropharynx is clear and moist.  Eyes: Conjunctivae and EOM are normal. Pupils are equal, round, and reactive to light.  Neck: Normal range of motion. Neck supple. No thyromegaly present.  Cardiovascular: Normal rate, regular rhythm and normal heart sounds.   No murmur heard. Pulmonary/Chest: No respiratory distress. He has no wheezes. He has no rales.  Abdominal: Soft. Bowel sounds are normal. He exhibits no distension and no mass. There is no tenderness. There is no rebound and no guarding.  Genitourinary: Rectum normal and prostate normal.  Musculoskeletal: He exhibits no edema.  Lymphadenopathy:    He has no cervical adenopathy.  Neurological: He is alert and oriented to person, place, and time. He displays normal reflexes. No cranial nerve deficit.  Skin: No rash noted.  Psychiatric: He has a normal mood and affect.          Assessment & Plan:  Complete physical. Discussed smoking cessation. Patient will try over-the-counter patches. Labs reviewed with patient. He has prediabetes with glucose 107. Dyslipidemia with low HDL and mildly elevated cholesterol. Erectile dysfunction.  Trial of Cialis and we provided samples of 5 mg daily and also called in prescription of Cialis 20 mg every other day as needed he'll try with 5 mg first. He'll call back if he has a preference. He knows not to take  these concomitantly. Repeat PSA in 6 months with high normal range today. He is strongly encouraged to consider colonoscopy this year

## 2012-01-23 ENCOUNTER — Other Ambulatory Visit: Payer: Self-pay | Admitting: Family Medicine

## 2012-01-23 ENCOUNTER — Encounter: Payer: Self-pay | Admitting: Family Medicine

## 2012-01-23 ENCOUNTER — Ambulatory Visit (INDEPENDENT_AMBULATORY_CARE_PROVIDER_SITE_OTHER): Payer: BC Managed Care – PPO | Admitting: Family Medicine

## 2012-01-23 VITALS — BP 120/84 | Temp 97.8°F | Wt 242.0 lb

## 2012-01-23 DIAGNOSIS — M25519 Pain in unspecified shoulder: Secondary | ICD-10-CM

## 2012-01-23 DIAGNOSIS — S40212A Abrasion of left shoulder, initial encounter: Secondary | ICD-10-CM

## 2012-01-23 DIAGNOSIS — M25512 Pain in left shoulder: Secondary | ICD-10-CM

## 2012-01-23 DIAGNOSIS — IMO0002 Reserved for concepts with insufficient information to code with codable children: Secondary | ICD-10-CM
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
## 2012-01-23 NOTE — Telephone Encounter (Signed)
Pt was seen today for shoulder pain. Pt is requesting muscle relaxer and pain med call into [facility]

## 2012-01-24 NOTE — Telephone Encounter (Signed)
Pt informed on cell VM 

## 2012-01-24 NOTE — Telephone Encounter (Signed)
Has pain worsened?  We specifically asked about whether he needed meds and he said "no".   Have him take over the counter Aleve and try some moist heat.

## 2012-03-19 ENCOUNTER — Ambulatory Visit: Payer: BC Managed Care – PPO | Admitting: Family Medicine

## 2012-09-06 ENCOUNTER — Other Ambulatory Visit: Payer: Self-pay | Admitting: Orthopedic Surgery

## 2012-09-06 ENCOUNTER — Encounter (HOSPITAL_BASED_OUTPATIENT_CLINIC_OR_DEPARTMENT_OTHER): Payer: Self-pay | Admitting: *Deleted

## 2012-09-06 NOTE — Progress Notes (Signed)
09/06/12 1650  OBSTRUCTIVE SLEEP APNEA  Have you ever been diagnosed with sleep apnea through a sleep study? No  Do you snore loudly (loud enough to be heard through closed doors)?  1  Do you often feel tired, fatigued, or sleepy during the daytime? 0  Has anyone observed you stop breathing during your sleep? 0  Do you have, or are you being treated for high blood pressure? 0  BMI more than 35 kg/m2? 1  Age over 59 years old? 1  Gender: 1  Obstructive Sleep Apnea Score 4  Score 4 or greater  Results sent to PCP

## 2012-09-06 NOTE — Progress Notes (Signed)
No heart or resp problems 

## 2012-09-07 ENCOUNTER — Encounter (HOSPITAL_BASED_OUTPATIENT_CLINIC_OR_DEPARTMENT_OTHER)
Admission: RE | Disposition: A | Discharge status: Home / Self Care | Payer: Self-pay | Source: Ambulatory Visit | Attending: Orthopedic Surgery

## 2012-09-07 ENCOUNTER — Encounter (HOSPITAL_BASED_OUTPATIENT_CLINIC_OR_DEPARTMENT_OTHER): Payer: Self-pay

## 2012-09-07 ENCOUNTER — Encounter (HOSPITAL_BASED_OUTPATIENT_CLINIC_OR_DEPARTMENT_OTHER): Payer: Self-pay | Admitting: Anesthesiology

## 2012-09-07 ENCOUNTER — Ambulatory Visit (HOSPITAL_BASED_OUTPATIENT_CLINIC_OR_DEPARTMENT_OTHER)
Admission: RE | Admit: 2012-09-07 | Discharge: 2012-09-07 | Disposition: A | Discharge status: Home / Self Care | Payer: Worker's Compensation | Source: Ambulatory Visit | Attending: Orthopedic Surgery | Admitting: Orthopedic Surgery

## 2012-09-07 ENCOUNTER — Ambulatory Visit (HOSPITAL_BASED_OUTPATIENT_CLINIC_OR_DEPARTMENT_OTHER): Payer: Worker's Compensation | Admitting: Anesthesiology

## 2012-09-07 DIAGNOSIS — E785 Hyperlipidemia, unspecified: Secondary | ICD-10-CM | POA: Insufficient documentation

## 2012-09-07 DIAGNOSIS — M19049 Primary osteoarthritis, unspecified hand: Secondary | ICD-10-CM | POA: Insufficient documentation

## 2012-09-07 DIAGNOSIS — Z1810 Retained metal fragments, unspecified: Secondary | ICD-10-CM | POA: Insufficient documentation

## 2012-09-07 DIAGNOSIS — Z6836 Body mass index (BMI) 36.0-36.9, adult: Secondary | ICD-10-CM | POA: Insufficient documentation

## 2012-09-07 DIAGNOSIS — E669 Obesity, unspecified: Secondary | ICD-10-CM | POA: Insufficient documentation

## 2012-09-07 DIAGNOSIS — N529 Male erectile dysfunction, unspecified: Secondary | ICD-10-CM | POA: Insufficient documentation

## 2012-09-07 DIAGNOSIS — K219 Gastro-esophageal reflux disease without esophagitis: Secondary | ICD-10-CM | POA: Insufficient documentation

## 2012-09-07 DIAGNOSIS — M795 Residual foreign body in soft tissue: Secondary | ICD-10-CM | POA: Insufficient documentation

## 2012-09-07 DIAGNOSIS — F17200 Nicotine dependence, unspecified, uncomplicated: Secondary | ICD-10-CM | POA: Insufficient documentation

## 2012-09-07 HISTORY — DX: Gastro-esophageal reflux disease without esophagitis: K21.9

## 2012-09-07 HISTORY — DX: Unspecified osteoarthritis, unspecified site: M19.90

## 2012-09-07 HISTORY — DX: Hyperlipidemia, unspecified: E78.5

## 2012-09-07 HISTORY — PX: FOREIGN BODY REMOVAL: SHX962

## 2012-09-07 SURGERY — REMOVAL FOREIGN BODY EXTREMITY
Anesthesia: General | Site: Thumb | Laterality: Right | Wound class: Dirty or Infected

## 2012-09-07 MED ORDER — MIDAZOLAM HCL 5 MG/5ML IJ SOLN
INTRAMUSCULAR | Status: DC | PRN
  Administered 2012-09-07: 2 mg via INTRAVENOUS

## 2012-09-07 MED ORDER — FENTANYL CITRATE 0.05 MG/ML IJ SOLN
INTRAMUSCULAR | Status: DC | PRN
  Administered 2012-09-07: 100 ug via INTRAVENOUS

## 2012-09-07 MED ORDER — DEXAMETHASONE SODIUM PHOSPHATE 10 MG/ML IJ SOLN
INTRAMUSCULAR | Status: DC | PRN
  Administered 2012-09-07: 10 mg via INTRAVENOUS

## 2012-09-07 MED ORDER — BUPIVACAINE HCL (PF) 0.25 % IJ SOLN
INTRAMUSCULAR | Status: DC | PRN
  Administered 2012-09-07: 10 mL

## 2012-09-07 MED ORDER — OXYCODONE HCL 5 MG PO TABS
5.0000 mg | ORAL_TABLET | Freq: Once | ORAL | Status: DC | PRN
Start: 2012-09-07 — End: 2012-09-07

## 2012-09-07 MED ORDER — LACTATED RINGERS IV SOLN
INTRAVENOUS | Status: DC
  Administered 2012-09-07 (×2): via INTRAVENOUS

## 2012-09-07 MED ORDER — MIDAZOLAM HCL 2 MG/2ML IJ SOLN: 1.0000 mg | INTRAMUSCULAR | Status: DC | PRN

## 2012-09-07 MED ORDER — MIDAZOLAM HCL 5 MG/5ML IJ SOLN: INTRAMUSCULAR | Status: DC | PRN

## 2012-09-07 MED ORDER — OXYCODONE HCL 5 MG/5ML PO SOLN: 5.0000 mg | Freq: Once | ORAL | Status: DC | PRN

## 2012-09-07 MED ORDER — PROPOFOL 10 MG/ML IV BOLUS
INTRAVENOUS | Status: DC | PRN
  Administered 2012-09-07: 200 mg via INTRAVENOUS

## 2012-09-07 MED ORDER — CEFAZOLIN SODIUM-DEXTROSE 2-3 GM-% IV SOLR
2.0000 g | INTRAVENOUS | Status: AC
  Administered 2012-09-07: 2 g via INTRAVENOUS

## 2012-09-07 MED ORDER — ONDANSETRON HCL 4 MG/2ML IJ SOLN: 4.0000 mg | Freq: Once | INTRAMUSCULAR | Status: DC | PRN

## 2012-09-07 MED ORDER — HYDROMORPHONE HCL PF 1 MG/ML IJ SOLN: 0.2500 mg | INTRAMUSCULAR | Status: DC | PRN

## 2012-09-07 MED ORDER — HYDROCODONE-ACETAMINOPHEN 5-325 MG PO TABS: ORAL_TABLET | ORAL | Status: DC

## 2012-09-07 MED ORDER — FENTANYL CITRATE 0.05 MG/ML IJ SOLN: 50.0000 ug | INTRAMUSCULAR | Status: DC | PRN

## 2012-09-07 MED ORDER — CHLORHEXIDINE GLUCONATE 4 % EX LIQD: 60.0000 mL | Freq: Once | CUTANEOUS | Status: DC

## 2012-09-07 MED ORDER — ONDANSETRON HCL 4 MG/2ML IJ SOLN
INTRAMUSCULAR | Status: DC | PRN
  Administered 2012-09-07: 4 mg via INTRAVENOUS

## 2012-09-07 SURGICAL SUPPLY — 47 items
BANDAGE ELASTIC 3 VELCRO ST LF (GAUZE/BANDAGES/DRESSINGS) IMPLANT
BANDAGE GAUZE ELAST BULKY 4 IN (GAUZE/BANDAGES/DRESSINGS) IMPLANT
BLADE MINI RND TIP GREEN BEAV (BLADE) IMPLANT
BLADE SURG 15 STRL LF DISP TIS (BLADE) ×2 IMPLANT
BLADE SURG 15 STRL SS (BLADE) ×2
BNDG COHESIVE 1X5 TAN STRL LF (GAUZE/BANDAGES/DRESSINGS) ×4 IMPLANT
BNDG ELASTIC 2 VLCR STRL LF (GAUZE/BANDAGES/DRESSINGS) IMPLANT
BNDG ESMARK 4X9 LF (GAUZE/BANDAGES/DRESSINGS) ×2 IMPLANT
CHLORAPREP W/TINT 26ML (MISCELLANEOUS) ×2 IMPLANT
CLOTH BEACON ORANGE TIMEOUT ST (SAFETY) ×2 IMPLANT
CORDS BIPOLAR (ELECTRODE) IMPLANT
COVER MAYO STAND STRL (DRAPES) ×2 IMPLANT
COVER TABLE BACK 60X90 (DRAPES) ×2 IMPLANT
CUFF TOURNIQUET SINGLE 18IN (TOURNIQUET CUFF) IMPLANT
CUFF TOURNIQUET SINGLE 24IN (TOURNIQUET CUFF) ×2 IMPLANT
DECANTER SPIKE VIAL GLASS SM (MISCELLANEOUS) ×2 IMPLANT
DRAPE EXTREMITY T 121X128X90 (DRAPE) ×2 IMPLANT
DRAPE SURG 17X23 STRL (DRAPES) ×2 IMPLANT
GAUZE XEROFORM 1X8 LF (GAUZE/BANDAGES/DRESSINGS) ×2 IMPLANT
GLOVE BIO SURGEON STRL SZ7.5 (GLOVE) ×2 IMPLANT
GLOVE ECLIPSE 6.5 STRL STRAW (GLOVE) ×4 IMPLANT
GOWN BRE IMP PREV XXLGXLNG (GOWN DISPOSABLE) ×2 IMPLANT
GOWN PREVENTION PLUS XLARGE (GOWN DISPOSABLE) ×4 IMPLANT
NDL SAFETY ECLIPSE 18X1.5 (NEEDLE) IMPLANT
NEEDLE HYPO 18GX1.5 SHARP (NEEDLE)
NEEDLE HYPO 25X1 1.5 SAFETY (NEEDLE) ×2 IMPLANT
NS IRRIG 1000ML POUR BTL (IV SOLUTION) ×2 IMPLANT
PACK BASIN DAY SURGERY FS (CUSTOM PROCEDURE TRAY) ×2 IMPLANT
PAD CAST 3X4 CTTN HI CHSV (CAST SUPPLIES) IMPLANT
PADDING CAST ABS 4INX4YD NS (CAST SUPPLIES) ×1
PADDING CAST ABS COTTON 4X4 ST (CAST SUPPLIES) ×1 IMPLANT
PADDING CAST COTTON 3X4 STRL (CAST SUPPLIES)
SPLINT FINGER 5/8X2.25 (CAST SUPPLIES) ×1 IMPLANT
SPLINT FNGR PLAIN END 5/8X3.25 (CAST SUPPLIES) ×1 IMPLANT
SPLINT PLASTALUME 2 1/4 (CAST SUPPLIES) ×2
SPLINT PLASTALUME 3 1/4 (CAST SUPPLIES) ×2
SPONGE GAUZE 4X4 12PLY (GAUZE/BANDAGES/DRESSINGS) ×2 IMPLANT
STOCKINETTE 4X48 STRL (DRAPES) ×2 IMPLANT
SUT ETHILON 3 0 PS 1 (SUTURE) IMPLANT
SUT ETHILON 4 0 PS 2 18 (SUTURE) IMPLANT
SUT MON AB 5-0 P3 18 (SUTURE) ×2 IMPLANT
SWAB COLLECTION DEVICE MRSA (MISCELLANEOUS) ×2 IMPLANT
SYR BULB 3OZ (MISCELLANEOUS) ×2 IMPLANT
SYR CONTROL 10ML LL (SYRINGE) ×2 IMPLANT
TOWEL OR 17X24 6PK STRL BLUE (TOWEL DISPOSABLE) ×4 IMPLANT
TUBE ANAEROBIC SPECIMEN COL (MISCELLANEOUS) ×2 IMPLANT
UNDERPAD 30X30 INCONTINENT (UNDERPADS AND DIAPERS) ×2 IMPLANT

## 2012-09-07 NOTE — Anesthesia Procedure Notes (Signed)
Procedure Name: LMA Insertion Date/Time: 09/07/2012 12:16 PM Performed by: Burna Cash Pre-anesthesia Checklist: Patient identified, Emergency Drugs available, Suction available and Patient being monitored Patient Re-evaluated:Patient Re-evaluated prior to inductionOxygen Delivery Method: Circle System Utilized Preoxygenation: Pre-oxygenation with 100% oxygen Intubation Type: IV induction Ventilation: Mask ventilation without difficulty LMA: LMA inserted LMA Size: 5.0 Number of attempts: 1 Airway Equipment and Method: bite block Placement Confirmation: positive ETCO2 Tube secured with: Tape Dental Injury: Teeth and Oropharynx as per pre-operative assessment

## 2012-09-07 NOTE — H&P (Signed)
  Keith Neal is an 57 y.o. male.   Chief Complaint: right thumb foreign body HPI: 57 yo rhd male states he got copper in right thumb while at work 09/03/12.  Seen at [facility] 09/05/12 and referred to me the following day.  Thumb was red and inflamed until started on antibiotics by urgent care.  No fevers, chills, night sweats.  Feels that there is something left in thumb.  Past Medical History  Diagnosis Date  . Obesity   . Dyslipidemia   . Tobacco user   . ED (erectile dysfunction)   . Hyperlipemia   . Arthritis     hands  . GERD (gastroesophageal reflux disease)     occ tums    Past Surgical History  Procedure Laterality Date  . Cholecystectomy  2009    Family History  Problem Relation Age of Onset  . Hypertension Mother   . Diabetes Mother   . Cancer Mother     breast   . Hypertension Father   . Diabetes Father   . Arthritis Other    Social History:  reports that he has been smoking Cigarettes.  He has a 30 pack-year smoking history. He does not have any smokeless tobacco history on file. He reports that  drinks alcohol. He reports that he does not use illicit drugs.  Allergies: No Known Allergies  Medications Prior to Admission  Medication Sig Dispense Refill  . cephALEXin (KEFLEX) 500 MG capsule Take 500 mg by mouth 3 (three) times daily.      . tadalafil (CIALIS) 20 MG tablet Take 20 mg by mouth daily as needed.        Results for orders placed during the hospital encounter of 09/07/12 (from the past 48 hour(s))  POCT HEMOGLOBIN-HEMACUE     Status: None   Collection Time    09/07/12 10:44 AM      Result Value Range   Hemoglobin 14.9  13.0 - 17.0 g/dL    No results found.   A comprehensive review of systems was negative except for: Eyes: positive for contacts/glasses  Blood pressure 148/75, pulse 84, temperature 98.4 F (36.9 C), temperature source Oral, resp. rate 20, height 5\' 9"  (1.753 m), weight 112.129 kg (247 lb 3.2 oz), SpO2 95.00%.  General  appearance: alert, cooperative and appears stated age Head: Normocephalic, without obvious abnormality, atraumatic Neck: supple, symmetrical, trachea midline Resp: clear to auscultation bilaterally Cardio: regular rate and rhythm GI: non tender Extremities: intact sensation and capillary refill all digits.  +epl/fpl/io.  right thumb with small puncture wound at tip.  no erythema currently.  no volar tenderness at proximal phalanx or ip joint.  palpable foreign body. Pulses: 2+ and symmetric Skin: as above Neurologic: Grossly normal Incision/Wound: As above  Assessment/Plan Right thumb foreign body.  Recommend removal foreign body in OR.  Risks, benefits, and alternatives of surgery were discussed and the patient agrees with the plan of care.   Antwann Preziosi R 09/07/2012, 12:00 PM

## 2012-09-07 NOTE — Op Note (Signed)
[id number]

## 2012-09-07 NOTE — Anesthesia Postprocedure Evaluation (Signed)
  Anesthesia Post-op Note  Patient: Keith Neal  Procedure(s) Performed: Procedure(s): REMOVAL FOREIGN BODY EXTREMITY (Right)  Patient Location: PACU  Anesthesia Type:General  Level of Consciousness: awake, alert  and oriented  Airway and Oxygen Therapy: Patient Spontanous Breathing  Post-op Pain: mild  Post-op Assessment: Post-op Vital signs reviewed  Post-op Vital Signs: Reviewed  Complications: No apparent anesthesia complications

## 2012-09-07 NOTE — Brief Op Note (Signed)
09/07/2012  12:47 PM  PATIENT:  [name]  57 y.o. male  PRE-OPERATIVE DIAGNOSIS:  RIGHT THUMB FOREIGN BODY  POST-OPERATIVE DIAGNOSIS:  RIGHT THUMB FOREIGN BODY  PROCEDURE:  Procedure(s): REMOVAL FOREIGN BODY EXTREMITY (Right)  SURGEON:  Surgeon(s) and Role:    * Tami Ribas, MD - Primary  PHYSICIAN ASSISTANT:   ASSISTANTS: none   ANESTHESIA:   general  EBL:  Total I/O In: 1300 [I.V.:1300] Out: -   BLOOD ADMINISTERED:none  DRAINS: iodoform packing  LOCAL MEDICATIONS USED:  MARCAINE     SPECIMEN:  Source of Specimen:  right thumb  DISPOSITION OF SPECIMEN:  micro  COUNTS:  YES  TOURNIQUET:   Total Tourniquet Time Documented: Upper Arm (Right) - 20 minutes Total: Upper Arm (Right) - 20 minutes   DICTATION: .Other Dictation: Dictation Number [id number]  PLAN OF CARE: Discharge to home after PACU  PATIENT DISPOSITION:  PACU - hemodynamically stable.

## 2012-09-07 NOTE — Anesthesia Preprocedure Evaluation (Addendum)
Anesthesia Evaluation  Patient identified by MRN, date of birth, ID band Patient awake    Reviewed: Allergy & Precautions, H&P , NPO status , Patient's Chart, lab work & pertinent test results  Airway Mallampati: II TM Distance: >3 FB Neck ROM: Full    Dental  (+) Partial Upper, Dental Advisory Given and Teeth Intact   Pulmonary  breath sounds clear to auscultation        Cardiovascular Rhythm:Regular Rate:Normal     Neuro/Psych    GI/Hepatic GERD-  Medicated and Controlled,  Endo/Other  Morbid obesity  Renal/GU      Musculoskeletal   Abdominal   Peds  Hematology   Anesthesia Other Findings   Reproductive/Obstetrics                           Anesthesia Physical Anesthesia Plan  ASA: II  Anesthesia Plan: General   Post-op Pain Management:    Induction: Intravenous  Airway Management Planned: LMA  Additional Equipment:   Intra-op Plan:   Post-operative Plan: Extubation in OR  Informed Consent: I have reviewed the patients History and Physical, chart, labs and discussed the procedure including the risks, benefits and alternatives for the proposed anesthesia with the patient or authorized representative who has indicated his/her understanding and acceptance.   Dental advisory given  Plan Discussed with: CRNA, Anesthesiologist and Surgeon  Anesthesia Plan Comments:         Anesthesia Quick Evaluation

## 2012-09-07 NOTE — Transfer of Care (Signed)
Immediate Anesthesia Transfer of Care Note  Patient: Keith Neal  Procedure(s) Performed: Procedure(s): REMOVAL FOREIGN BODY EXTREMITY (Right)  Patient Location: PACU  Anesthesia Type:General  Level of Consciousness: awake and patient cooperative  Airway & Oxygen Therapy: Patient Spontanous Breathing and Patient connected to face mask oxygen  Post-op Assessment: Report given to PACU RN and Post -op Vital signs reviewed and stable  Post vital signs: Reviewed and stable  Complications: No apparent anesthesia complications

## 2012-09-08 NOTE — Op Note (Signed)
NAMEDAMARIOUS, HOLTSCLAW                 ACCOUNT NO.:  [id number]  MEDICAL RECORD NO.:  [id number]  LOCATION:                                 FACILITY:  PHYSICIAN:  Betha Loa, MD             DATE OF BIRTH:  DATE OF PROCEDURE:  09/07/2012 DATE OF DISCHARGE:                              OPERATIVE REPORT   PREOPERATIVE DIAGNOSIS:  Foreign body, right thumb.  POSTOPERATIVE DIAGNOSIS:  Foreign body, right thumb.  PROCEDURE:  Removal of foreign body, right thumb.  SURGEON:  Betha Loa, MD  ASSISTANT:  None.  ANESTHESIA:  General.  IV FLUIDS:  Per anesthesia flow sheet.  ESTIMATED BLOOD LOSS:  Minimal.  COMPLICATIONS:  None.  SPECIMENS:  Right thumb cultures to micro and metallic fragments given the patient.  TOURNIQUET TIME:  20 minutes.  DISPOSITION:  Stable to PACU.  INDICATIONS:  Mr. 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  He could feel the foreign body.  He was seen at urgent care facility 2 days ago and referred to me yesterday.  He states the thumb was inflamed and infected.  He was started on antibiotics.  He states this has resolved. He has retained foreign body.  He would like it removed.  Risks, benefits, alternatives of the surgery were discussed including risk of blood loss, infection, damage to nerves, vessels, tendons, ligaments, bone; failure of surgery; need for additional surgery, complications with wound healing; continued pain; and retained foreign body.  He voiced understanding of these risks and elected to proceed.  OPERATIVE COURSE:  After being identified preoperatively by myself, the patient and I agreed upon procedure and site procedure.  Surgical site was marked.  The risks, benefits, and alternatives were reviewed and wished to proceed.  Surgical consent had been signed.  He was given IV Ancef as  preoperative antibiotic prophylaxis.  He was transported to the operating room, placed on the operating room table in supine position with a right upper extremity on arm board.  General anesthesia was induced by anesthesiologist.  Right upper extremity was prepped and draped in normal sterile orthopedic fashion.  Surgical pause was performed between surgeons, anesthesia, operating staff, and all were in agreement as to the patient, procedure, and site of procedure. Tourniquet at the proximal aspect of the extremities inflated to 250 mmHg after exsanguination of the limb with an Esmarch bandage.  An incision was made including the traumatic portion of the wound.  One piece of copper was easily identified and removed.  The remaining piece was more difficult to find.  C-arm was used in AP and lateral projections to aid in this.  The copper fragment was found and removed. C-arm was used in AP and lateral projections.  No radiopaque foreign body was remaining.  There was some cloudy fluid within the thumb but no gross purulence.  Cultures were taken and sent to micro for aerobic and anaerobic cultures.  The wound was copiously irrigated with sterile saline.  A piece  of iodoform packing was placed in the wound exiting at the traumatic portion.  The surgical portion of the wound proximally was closed with 5-0 Monocryl suture.  The iodoform packing was left coming out from the wound distally.  A digital block was performed with 10 mL of 0.25% plain Marcaine to aid in postoperative analgesia.  It was then dressed with sterile Xeroform, 4x4s, and wrapped with a Coban dressing lightly.  An AlumaFoam splint was placed and wrapped with Coban lightly. Tourniquet was deflated at 20 minutes.  The operative drapes were broken down.  The patient was awoken from anesthesia safely.  He was transferred back to stretcher and taken to PACU in stable condition.  I will see him back in the office on Monday for  postprocedure followup.  I will give him Norco 5/325, 1-2 p.o. q.6 hours p.r.n. pain, dispensed #40.  We will continue his Keflex prescription.     Betha Loa, MD     KK/MEDQ  D:  09/07/2012  T:  09/08/2012  Job:  [id number]

## 2012-09-09 LAB — WOUND CULTURE: Gram Stain: NONE SEEN

## 2012-09-10 ENCOUNTER — Encounter (HOSPITAL_BASED_OUTPATIENT_CLINIC_OR_DEPARTMENT_OTHER): Payer: Self-pay | Admitting: Orthopedic Surgery

## 2012-09-12 LAB — ANAEROBIC CULTURE

## 2012-09-28 ENCOUNTER — Ambulatory Visit: Payer: Self-pay | Admitting: Family Medicine

## 2012-10-03 ENCOUNTER — Ambulatory Visit (INDEPENDENT_AMBULATORY_CARE_PROVIDER_SITE_OTHER): Payer: BC Managed Care – PPO | Admitting: Family Medicine

## 2012-10-03 ENCOUNTER — Encounter: Payer: Self-pay | Admitting: Family Medicine

## 2012-10-03 VITALS — BP 140/90 | Temp 97.9°F | Wt 250.0 lb

## 2012-10-03 DIAGNOSIS — K219 Gastro-esophageal reflux disease without esophagitis: Secondary | ICD-10-CM

## 2012-10-03 MED ORDER — PANTOPRAZOLE SODIUM 40 MG PO TBEC: 40.0000 mg | DELAYED_RELEASE_TABLET | Freq: Every day | ORAL | Status: DC

## 2012-10-03 NOTE — Patient Instructions (Signed)

## 2012-10-03 NOTE — Progress Notes (Signed)
  Subjective:    Patient ID: Keith Neal, male    DOB: [birth year], 57 y.o.   MRN: [id number]  HPI Epigastric burning pain off and on. Worse with fatty foods. No chest pain.  Not exertional. Onset about 6 months ago. Fatty and spicy foods make worse. Moderate caffeine.  No regular NSAIDS About 6 beers per week.  No melena. Took TUMS and Pepcid with mild relief.  No hx of PUD.   Review of Systems  Constitutional: Negative for appetite change and unexpected weight change.  HENT: Negative for trouble swallowing.   Respiratory: Negative for shortness of breath.   Cardiovascular: Negative for chest pain and leg swelling.  Gastrointestinal: Positive for abdominal pain. Negative for nausea, vomiting, diarrhea, blood in stool and abdominal distention.       Objective:   Physical Exam  Constitutional: He appears well-developed and well-nourished.  Cardiovascular: Normal rate and regular rhythm.   Pulmonary/Chest: Effort normal and breath sounds normal. No respiratory distress. He has no wheezes. He has no rales.  Abdominal: Soft. Bowel sounds are normal. He exhibits no distension and no mass. There is no tenderness. There is no rebound and no guarding.          Assessment & Plan:   Patient has GERD. Needs to lose some weight. Discussed lifestyle management and dietary modification. Reduce caffeine use. Watch alcohol consumption. Start Protonix 40 mg once daily. After one month if symptoms stable try tapering off.

## 2012-10-23 ENCOUNTER — Telehealth: Payer: Self-pay | Admitting: Family Medicine

## 2012-10-23 NOTE — Telephone Encounter (Signed)
Patient requesting refill on xanax. Please advise.

## 2012-10-24 MED ORDER — ALPRAZOLAM 0.5 MG PO TABS: 0.5000 mg | ORAL_TABLET | Freq: Every evening | ORAL | Status: DC | PRN

## 2012-10-24 NOTE — Telephone Encounter (Signed)
Refill once.  Avoid regular use. 

## 2012-10-24 NOTE — Telephone Encounter (Signed)
Our records show xanax last filled 03-09-11, #30 with 0 refills

## 2012-11-23 ENCOUNTER — Ambulatory Visit: Payer: Self-pay | Admitting: Family Medicine

## 2012-11-26 ENCOUNTER — Encounter: Payer: Self-pay | Admitting: Family Medicine

## 2012-11-26 ENCOUNTER — Ambulatory Visit (INDEPENDENT_AMBULATORY_CARE_PROVIDER_SITE_OTHER): Payer: BC Managed Care – PPO | Admitting: Family Medicine

## 2012-11-26 VITALS — BP 122/90 | Temp 98.6°F

## 2012-11-26 DIAGNOSIS — L989 Disorder of the skin and subcutaneous tissue, unspecified: Secondary | ICD-10-CM

## 2012-11-26 NOTE — Progress Notes (Signed)
  Subjective:    Patient ID: Keith Neal, male    DOB: 07-26-1955, 57 y.o.   MRN: [id number]  HPI Patient seen regarding concern for skin lesions noted recently on face He has scaly area left forehead which he scrapes off occasionally. He noticed slightly elevated lesion left side of nose and also brown minimally raised scaly area just below left lower lid He denies any personal history of skin cancer but has had significant sun exposure   Review of Systems  Constitutional: Negative for appetite change and unexpected weight change.  Hematological: Negative for adenopathy.       Objective:   Physical Exam  Constitutional: He appears well-developed and well-nourished.  Cardiovascular: Normal rate and regular rhythm.   Skin:  Patient has slightly raised approximately 2 mm lesion left side of nose near base.  On magnification, slightly umbilicated center. Just superior and lateral to this he has brown scaly fairly well demarcated lesion approximately 6 mm diameter          Assessment & Plan:  Skin lesion left nose. Rule out basal cell carcinoma. He also has a second lesion which appears to be consistent with seborrheic keratosis.  Given location, set up with skin surgery Center

## 2012-11-26 NOTE — Patient Instructions (Addendum)
We will call you with dermatology appointment. 

## 2013-01-29 ENCOUNTER — Other Ambulatory Visit: Payer: Self-pay | Admitting: Family Medicine

## 2013-01-30 NOTE — Telephone Encounter (Signed)
Last refill 10/24/12 #30 no refills

## 2013-01-30 NOTE — Telephone Encounter (Signed)
Refill once.  Avoid regular use. 

## 2013-02-24 ENCOUNTER — Other Ambulatory Visit: Payer: Self-pay | Admitting: Family Medicine

## 2013-03-14 ENCOUNTER — Ambulatory Visit (INDEPENDENT_AMBULATORY_CARE_PROVIDER_SITE_OTHER): Payer: BC Managed Care – PPO | Admitting: Family Medicine

## 2013-03-14 ENCOUNTER — Encounter: Payer: Self-pay | Admitting: Family Medicine

## 2013-03-14 VITALS — BP 130/78 | HR 84 | Temp 97.4°F | Wt 246.0 lb

## 2013-03-14 DIAGNOSIS — Z7189 Other specified counseling: Secondary | ICD-10-CM

## 2013-03-14 DIAGNOSIS — Z23 Encounter for immunization: Secondary | ICD-10-CM

## 2013-03-14 DIAGNOSIS — M549 Dorsalgia, unspecified: Secondary | ICD-10-CM

## 2013-03-14 DIAGNOSIS — Z716 Tobacco abuse counseling: Secondary | ICD-10-CM

## 2013-03-14 DIAGNOSIS — N529 Male erectile dysfunction, unspecified: Secondary | ICD-10-CM

## 2013-03-14 DIAGNOSIS — M546 Pain in thoracic spine: Secondary | ICD-10-CM

## 2013-03-14 MED ORDER — METHOCARBAMOL 500 MG PO TABS: 500.0000 mg | ORAL_TABLET | Freq: Three times a day (TID) | ORAL | Status: DC | PRN

## 2013-03-14 MED ORDER — VARENICLINE TARTRATE 1 MG PO TABS: 1.0000 mg | ORAL_TABLET | Freq: Two times a day (BID) | ORAL | Status: DC

## 2013-03-14 MED ORDER — VARENICLINE TARTRATE 0.5 MG X 11 & 1 MG X 42 PO MISC: ORAL | Status: DC

## 2013-03-14 NOTE — Progress Notes (Signed)
  Subjective:    Patient ID: Keith Neal, male    DOB: [birth year], 57 y.o.   MRN: [id number]  HPI Recent basal cell carcinoma excised left face. He is done well since then. He seen today for the following issues  He's ready to quit smoking. He has previously tried nicotine replacement. He specifically wishes to try Chantix. Currently smokes about one pack cigarettes per day. He has not had any recent depression or specific increased anxiety symptoms. He has never tried Chantix previously  Patient complains of some stiffness and soreness left trapezius muscles. No cervical radiculopathy symptoms. No injury. He's tried topical creams without much improvement. He has not tried any anti-inflammatories. No upper extremity numbness or weakness.  History of erectile dysfunction. Requesting samples of medication. He has used both Cialis and Viagra in the past with fairly good success.  Past Medical History  Diagnosis Date  . Obesity   . Dyslipidemia   . Tobacco user   . ED (erectile dysfunction)   . Hyperlipemia   . Arthritis     hands  . GERD (gastroesophageal reflux disease)     occ tums   Past Surgical History  Procedure Laterality Date  . Cholecystectomy  2009  . Foreign body removal Right 09/07/2012    Procedure: REMOVAL FOREIGN BODY EXTREMITY;  Surgeon: Tami Ribas, MD;  Location: [facility];  Service: Orthopedics;  Laterality: Right;    reports that he has been smoking Cigarettes.  He has a 30 pack-year smoking history. He does not have any smokeless tobacco history on file. He reports that  drinks alcohol. He reports that he does not use illicit drugs. family history includes Arthritis in his other; Cancer in his mother; Diabetes in his father and mother; Hypertension in his father and mother. No Known Allergies    Review of Systems  Constitutional: Negative for appetite change, fatigue and unexpected weight change.  HENT: Negative for sore throat and trouble  swallowing.   Eyes: Negative for visual disturbance.  Respiratory: Negative for cough, chest tightness and shortness of breath.   Cardiovascular: Negative for chest pain, palpitations and leg swelling.  Endocrine: Negative for polydipsia and polyuria.  Musculoskeletal: Positive for back pain.  Neurological: Negative for dizziness, syncope, weakness, light-headedness and headaches.  Hematological: Negative for adenopathy.       Objective:   Physical Exam  Constitutional: He appears well-developed and well-nourished.  HENT:  Mouth/Throat: Oropharynx is clear and moist.  Neck: Neck supple. No thyromegaly present.  Cardiovascular: Normal rate.   Pulmonary/Chest: Effort normal and breath sounds normal. No respiratory distress. He has no wheezes. He has no rales.  Musculoskeletal: He exhibits no edema.  Full range of motion cervical spine. He has some mild to moderate left trapezius tenderness. Some palpable muscle tension.  Lymphadenopathy:    He has no cervical adenopathy.  Neurological:  Full-strength upper extremities. Symmetric reflexes          Assessment & Plan:  #1 muscular strain left trapezius. Continue moist heat along with muscle massage. Add Robaxin 500 mg every 8 hours as needed #2 smoking cessation discussed. Trial of Chantix with initial starter pack and then maintenance pack for total 3 months of therapy. We discussed use. #3 erectile dysfunction. Samples of Levitra 20 mg one half to one tablet daily as needed

## 2013-03-14 NOTE — Patient Instructions (Addendum)
Smoking Cessation Quitting smoking is important to your health and has many advantages. However, it is not always easy to quit since nicotine is a very addictive drug. Often times, people try 3 times or more before being able to quit. This document explains the best ways for you to prepare to quit smoking. Quitting takes hard work and a lot of effort, but you can do it. ADVANTAGES OF QUITTING SMOKING  You will live longer, feel better, and live better.  Your body will feel the impact of quitting smoking almost immediately.  Within 20 minutes, blood pressure decreases. Your pulse returns to its normal level.  After 8 hours, carbon monoxide levels in the blood return to normal. Your oxygen level increases.  After 24 hours, the chance of having a heart attack starts to decrease. Your breath, hair, and body stop smelling like smoke.  After 48 hours, damaged nerve endings begin to recover. Your sense of taste and smell improve.  After 72 hours, the body is virtually free of nicotine. Your bronchial tubes relax and breathing becomes easier.  After 2 to 12 weeks, lungs can hold more air. Exercise becomes easier and circulation improves.  The risk of having a heart attack, stroke, cancer, or lung disease is greatly reduced.  After 1 year, the risk of coronary heart disease is cut in half.  After 5 years, the risk of stroke falls to the same as a nonsmoker.  After 10 years, the risk of lung cancer is cut in half and the risk of other cancers decreases significantly.  After 15 years, the risk of coronary heart disease drops, usually to the level of a nonsmoker.  If you are pregnant, quitting smoking will improve your chances of having a healthy baby.  The people you live with, especially any children, will be healthier.  You will have extra money to spend on things other than cigarettes. QUESTIONS TO THINK ABOUT BEFORE ATTEMPTING TO QUIT You may want to talk about your answers with your  caregiver.  Why do you want to quit?  If you tried to quit in the past, what helped and what did not?  What will be the most difficult situations for you after you quit? How will you plan to handle them?  Who can help you through the tough times? Your family? Friends? A caregiver?  What pleasures do you get from smoking? What ways can you still get pleasure if you quit? Here are some questions to ask your caregiver:  How can you help me to be successful at quitting?  What medicine do you think would be best for me and how should I take it?  What should I do if I need more help?  What is smoking withdrawal like? How can I get information on withdrawal? GET READY  Set a quit date.  Change your environment by getting rid of all cigarettes, ashtrays, matches, and lighters in your home, car, or work. Do not let people smoke in your home.  Review your past attempts to quit. Think about what worked and what did not. GET SUPPORT AND ENCOURAGEMENT You have a better chance of being successful if you have help. You can get support in many ways.  Tell your family, friends, and co-workers that you are going to quit and need their support. Ask them not to smoke around you.  Get individual, group, or telephone counseling and support. Programs are available at local hospitals and health centers. Call your local health department for   information about programs in your area.  Spiritual beliefs and practices may help some smokers quit.  Download a "quit meter" on your computer to keep track of quit statistics, such as how long you have gone without smoking, cigarettes not smoked, and money saved.  Get a self-help book about quitting smoking and staying off of tobacco. LEARN NEW SKILLS AND BEHAVIORS  Distract yourself from urges to smoke. Talk to someone, go for a walk, or occupy your time with a task.  Change your normal routine. Take a different route to work. Drink tea instead of coffee.  Eat breakfast in a different place.  Reduce your stress. Take a hot bath, exercise, or read a book.  Plan something enjoyable to do every day. Reward yourself for not smoking.  Explore interactive web-based programs that specialize in helping you quit. GET MEDICINE AND USE IT CORRECTLY Medicines can help you stop smoking and decrease the urge to smoke. Combining medicine with the above behavioral methods and support can greatly increase your chances of successfully quitting smoking.  Nicotine replacement therapy helps deliver nicotine to your body without the negative effects and risks of smoking. Nicotine replacement therapy includes nicotine gum, lozenges, inhalers, nasal sprays, and skin patches. Some may be available over-the-counter and others require a prescription.  Antidepressant medicine helps people abstain from smoking, but how this works is unknown. This medicine is available by prescription.  Nicotinic receptor partial agonist medicine simulates the effect of nicotine in your brain. This medicine is available by prescription. Ask your caregiver for advice about which medicines to use and how to use them based on your health history. Your caregiver will tell you what side effects to look out for if you choose to be on a medicine or therapy. Carefully read the information on the package. Do not use any other product containing nicotine while using a nicotine replacement product.  RELAPSE OR DIFFICULT SITUATIONS Most relapses occur within the first 3 months after quitting. Do not be discouraged if you start smoking again. Remember, most people try several times before finally quitting. You may have symptoms of withdrawal because your body is used to nicotine. You may crave cigarettes, be irritable, feel very hungry, cough often, get headaches, or have difficulty concentrating. The withdrawal symptoms are only temporary. They are strongest when you first quit, but they will go away within  10 14 days. To reduce the chances of relapse, try to:  Avoid drinking alcohol. Drinking lowers your chances of successfully quitting.  Reduce the amount of caffeine you consume. Once you quit smoking, the amount of caffeine in your body increases and can give you symptoms, such as a rapid heartbeat, sweating, and anxiety.  Avoid smokers because they can make you want to smoke.  Do not let weight gain distract you. Many smokers will gain weight when they quit, usually less than 10 pounds. Eat a healthy diet and stay active. You can always lose the weight gained after you quit.  Find ways to improve your mood other than smoking. FOR MORE INFORMATION  www.smokefree.gov  Document Released: 05/31/2001 Document Revised: 12/06/2011 Document Reviewed: 09/15/2011 [employer] Patient Information 2014 [employer].  

## 2013-03-28 ENCOUNTER — Ambulatory Visit (INDEPENDENT_AMBULATORY_CARE_PROVIDER_SITE_OTHER): Payer: BC Managed Care – PPO | Admitting: Family Medicine

## 2013-03-28 ENCOUNTER — Encounter: Payer: Self-pay | Admitting: Family Medicine

## 2013-03-28 VITALS — BP 140/88 | HR 88 | Temp 98.0°F | Wt 238.7 lb

## 2013-03-28 DIAGNOSIS — M542 Cervicalgia: Secondary | ICD-10-CM

## 2013-03-28 DIAGNOSIS — R042 Hemoptysis: Secondary | ICD-10-CM

## 2013-03-28 MED ORDER — AZITHROMYCIN 250 MG PO TABS: ORAL_TABLET | ORAL | Status: AC

## 2013-03-28 NOTE — Progress Notes (Signed)
  Subjective:    Patient ID: Keith Neal, male    DOB: 1956/01/01, 57 y.o.   MRN: [id number]  HPI Patient seen with some left-sided neck pain. Persistent from last visit. No injury. Pain is somewhat poorly localized mostly left anterior cervical try oriented. No rashes. No adenopathy. No pain with swallowing. He has lost about 8 pounds from last visit he thinks is related to lifestyle change. He has good appetite. Denies any fever or chills. Has taken ibuprofen for neck pain which helps slightly. With all this may be muscular and he tried some Robaxin which did not help. He does not have a typical radiculopathy symptoms  Patient also had one episode of coughing up some yellowish sputum and blood this morning. Only one episode of hemoptysis. No pleuritic pain. No tachycardia. No dyspnea. No fevers or chills. Long history of smoking.  Past Medical History  Diagnosis Date  . Obesity   . Dyslipidemia   . Tobacco user   . ED (erectile dysfunction)   . Hyperlipemia   . Arthritis     hands  . GERD (gastroesophageal reflux disease)     occ tums   Past Surgical History  Procedure Laterality Date  . Cholecystectomy  2009  . Foreign body removal Right 09/07/2012    Procedure: REMOVAL FOREIGN BODY EXTREMITY;  Surgeon: Tami Ribas, MD;  Location: [facility];  Service: Orthopedics;  Laterality: Right;    reports that he has been smoking Cigarettes.  He has a 30 pack-year smoking history. He does not have any smokeless tobacco history on file. He reports that he drinks alcohol. He reports that he does not use illicit drugs. family history includes Arthritis in his other; Cancer in his mother; Diabetes in his father and mother; Hypertension in his father and mother. No Known Allergies    Review of Systems  Constitutional: Negative for fever, chills, appetite change and fatigue.  HENT: Negative for sinus pressure, sore throat, trouble swallowing and voice change.   Respiratory:  Positive for cough. Negative for shortness of breath and wheezing.   Cardiovascular: Negative for chest pain, palpitations and leg swelling.  Gastrointestinal: Negative for abdominal pain.  Hematological: Negative for adenopathy. Does not bruise/bleed easily.       Objective:   Physical Exam  Constitutional: He appears well-developed and well-nourished.  HENT:  Right Ear: External ear normal.  Left Ear: External ear normal.  Mouth/Throat: Oropharynx is clear and moist.  Neck: Neck supple. No thyromegaly present.  Cardiovascular: Normal rate and regular rhythm.   Pulmonary/Chest: Effort normal and breath sounds normal. No respiratory distress. He has no wheezes. He has no rales.  Lymphadenopathy:    He has no cervical adenopathy.  Neurological: He has normal reflexes.          Assessment & Plan:  #1 left-sided neck pain. Nonfocal exam. Question musculoskeletal. He has not had any adenopathy. He does not have any palpated masses. Continue anti-inflammatory. Consider ENT referral if pain persists #2 cough with one episode of hemoptysis in a smoker. He has not had any other associated symptoms such as fever, dyspnea, pleuritic pain. Obtain chest x-ray. Zithromax for 5 days.

## 2013-03-28 NOTE — Patient Instructions (Signed)
Call in one week if no improvement.

## 2013-04-03 ENCOUNTER — Ambulatory Visit (INDEPENDENT_AMBULATORY_CARE_PROVIDER_SITE_OTHER)
Admission: RE | Admit: 2013-04-03 | Discharge: 2013-04-03 | Disposition: A | Discharge status: Home / Self Care | Payer: BC Managed Care – PPO | Source: Ambulatory Visit | Attending: Family Medicine | Admitting: Family Medicine

## 2013-04-03 DIAGNOSIS — R042 Hemoptysis: Secondary | ICD-10-CM

## 2013-04-04 ENCOUNTER — Telehealth: Payer: Self-pay | Admitting: Family Medicine

## 2013-04-04 NOTE — Telephone Encounter (Signed)
Pt informed bout results

## 2013-04-04 NOTE — Telephone Encounter (Signed)
Pt is calling to inquire about his xray results from 04/03/13. Please assist.

## 2013-06-04 ENCOUNTER — Telehealth: Payer: Self-pay | Admitting: Family Medicine

## 2013-06-04 NOTE — Telephone Encounter (Signed)
RN attempted to reach patient regarding "prescription issue."  On callback, RN unable to reach patient at number provided; message left on unidentified voicemail to contact office for assistance.  krs/can

## 2013-06-05 MED ORDER — TADALAFIL 20 MG PO TABS: 20.0000 mg | ORAL_TABLET | Freq: Every day | ORAL | Status: DC | PRN

## 2013-06-05 NOTE — Telephone Encounter (Signed)
Pt wanted RX for Cialis, RX is sent to pharmacy

## 2013-06-05 NOTE — Telephone Encounter (Signed)
Called pt no answer no VM.

## 2013-08-26 ENCOUNTER — Ambulatory Visit (INDEPENDENT_AMBULATORY_CARE_PROVIDER_SITE_OTHER): Payer: BC Managed Care – PPO | Admitting: Family Medicine

## 2013-08-26 ENCOUNTER — Encounter: Payer: Self-pay | Admitting: Family Medicine

## 2013-08-26 VITALS — BP 124/84 | Temp 98.5°F | Wt 237.0 lb

## 2013-08-26 DIAGNOSIS — B9789 Other viral agents as the cause of diseases classified elsewhere: Secondary | ICD-10-CM

## 2013-08-26 DIAGNOSIS — J45901 Unspecified asthma with (acute) exacerbation: Secondary | ICD-10-CM

## 2013-08-26 DIAGNOSIS — Z720 Tobacco use: Secondary | ICD-10-CM

## 2013-08-26 DIAGNOSIS — F17200 Nicotine dependence, unspecified, uncomplicated: Secondary | ICD-10-CM

## 2013-08-26 DIAGNOSIS — J069 Acute upper respiratory infection, unspecified: Secondary | ICD-10-CM

## 2013-08-26 MED ORDER — HYDROCODONE-HOMATROPINE 5-1.5 MG/5ML PO SYRP: 5.0000 mL | ORAL_SOLUTION | Freq: Three times a day (TID) | ORAL | Status: DC | PRN

## 2013-08-26 MED ORDER — VARENICLINE TARTRATE 1 MG PO TABS: ORAL_TABLET | ORAL | Status: DC

## 2013-08-26 MED ORDER — PREDNISONE 20 MG PO TABS: ORAL_TABLET | ORAL | Status: DC

## 2013-08-26 NOTE — Progress Notes (Signed)
Pre visit review using our clinic review tool, if applicable. No additional management support is needed unless otherwise documented below in the visit note. 

## 2013-08-26 NOTE — Patient Instructions (Signed)
Absolutely no smoking  Chantix 1 mg........... one half tab daily for 2 weeks then one tab daily  Prednisone 20 mg,,,,,,,,,, 2 tabs x3 days then taper as outlined  ,, Hydromet 1/2-1 teaspoon 3 times daily. For cough  Return to see Dr. Caryl NeverBurchette in one week for followup

## 2013-08-26 NOTE — Progress Notes (Signed)
   Subjective:    Patient ID: Keith Neal, male    DOB: 24-May-1956, 58 y.o.   MRN: [id number]  HPI Keith Neal is a 58 year old divorce male smoker one pack a day who comes in today for evaluation of a cold for one week  Is a cold for the past week head congestion postnasal drip and cough. No fever chills earache sore throat nausea vomiting or diarrhea etc.  He is a long-term smoker.   Review of Systems Review of systems negative    Objective:   Physical Exam  Well-developed well-nourished male no acute distress vital signs stable he is afebrile ENT negative neck was supple no adenopathy lungs exam he doesn't have good breath sounds but he does have some mild expiratory wheezing. 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

## 2013-08-27 ENCOUNTER — Telehealth: Payer: Self-pay | Admitting: Family Medicine

## 2013-08-27 NOTE — Telephone Encounter (Signed)
Relevant patient education assigned to patient using Emmi. ° °

## 2013-08-30 ENCOUNTER — Encounter: Payer: Self-pay | Admitting: *Deleted

## 2013-09-02 ENCOUNTER — Encounter: Payer: Self-pay | Admitting: Family Medicine

## 2013-09-02 ENCOUNTER — Ambulatory Visit (INDEPENDENT_AMBULATORY_CARE_PROVIDER_SITE_OTHER): Payer: BC Managed Care – PPO | Admitting: Family Medicine

## 2013-09-02 VITALS — BP 134/80 | HR 82 | Temp 98.3°F | Wt 234.0 lb

## 2013-09-02 DIAGNOSIS — J019 Acute sinusitis, unspecified: Secondary | ICD-10-CM

## 2013-09-02 MED ORDER — CEFUROXIME AXETIL 250 MG PO TABS: 250.0000 mg | ORAL_TABLET | Freq: Two times a day (BID) | ORAL | Status: DC

## 2013-09-02 NOTE — Progress Notes (Signed)
   Subjective:    Patient ID: Keith PlowmanRandy E Neal, male    DOB: 1955-07-27, 58 y.o.   MRN: [id number]  Sinusitis Associated symptoms include congestion, coughing and sinus pressure. Pertinent negatives include no chills or shortness of breath.   Patient is seen with persistent respiratory symptoms. He was seen last week and started on prednisone but only took this for a few days at half dosage. He had over 2 week history of nonproductive cough and some persistent sinus congestion symptoms. Denies any fever. He still smokes. He is considering quitting. He denies any hemoptysis. No dyspnea. No obvious wheezing. No history of reactive airway symptoms. She's had about 4 days of some intermittent hoarseness.  Past Medical History  Diagnosis Date  . Obesity   . Dyslipidemia   . Tobacco user   . ED (erectile dysfunction)   . Hyperlipemia   . Arthritis     hands  . GERD (gastroesophageal reflux disease)     occ tums   Past Surgical History  Procedure Laterality Date  . Cholecystectomy  2009  . Foreign body removal Right 09/07/2012    Procedure: REMOVAL FOREIGN BODY EXTREMITY;  Surgeon: Tami RibasKevin R Kuzma, MD;  [facility];  Service: Orthopedics;  Laterality: Right;    reports that he has been smoking Cigarettes.  He has a 30 pack-year smoking history. He does not have any smokeless tobacco history on file. He reports that he drinks alcohol. He reports that he does not use illicit drugs. family history includes Arthritis in his other; Cancer in his mother; Diabetes in his father and mother; Hypertension in his father and mother. No Known Allergies    Review of Systems  Constitutional: Negative for fever and chills.  HENT: Positive for congestion and sinus pressure.   Respiratory: Positive for cough. Negative for shortness of breath and wheezing.        Objective:   Physical Exam  Constitutional: He appears well-developed and well-nourished.  HENT:  Right Ear: External  ear normal.  Left Ear: External ear normal.  Mouth/Throat: Oropharynx is clear and moist.  Neck: Neck supple.  Cardiovascular: Normal rate.   Pulmonary/Chest: Effort normal and breath sounds normal. No respiratory distress. He has no wheezes. He has no rales.  Lymphadenopathy:    He has no cervical adenopathy.          Assessment & Plan:  Sinusitis. Given duration of symptoms start Ceftin 250 mg twice daily for 10 days. Touch base and one week if hoarseness is not resolving. We discussed smoking cessation and he has already prescription for Chantix which he will consider filling

## 2013-09-02 NOTE — Progress Notes (Signed)
Pre visit review using our clinic review tool, if applicable. No additional management support is needed unless otherwise documented below in the visit note. 

## 2013-09-19 ENCOUNTER — Telehealth: Payer: Self-pay | Admitting: Family Medicine

## 2013-09-19 MED ORDER — TADALAFIL 20 MG PO TABS: 20.0000 mg | ORAL_TABLET | Freq: Every day | ORAL | Status: DC | PRN

## 2013-09-19 NOTE — Telephone Encounter (Signed)
Rx sent to pharmacy   

## 2013-09-19 NOTE — Telephone Encounter (Signed)
Pt request tadalafil (CIALIS) 20 MG tablet [facility] [address]

## 2013-09-24 ENCOUNTER — Telehealth: Payer: Self-pay | Admitting: Family Medicine

## 2013-09-24 MED ORDER — TADALAFIL 20 MG PO TABS: 20.0000 mg | ORAL_TABLET | Freq: Every day | ORAL | Status: DC | PRN

## 2013-09-24 NOTE — Telephone Encounter (Signed)
Sent RX to pharmacy 

## 2013-09-24 NOTE — Telephone Encounter (Signed)
The PA for Cialis was approved but the quantity is for 4.  The insurance will not allow any quantities over 4.

## 2013-10-04 ENCOUNTER — Telehealth: Payer: Self-pay | Admitting: Family Medicine

## 2013-10-04 MED ORDER — SILDENAFIL CITRATE 100 MG PO TABS: 100.0000 mg | ORAL_TABLET | Freq: Every day | ORAL | Status: DC | PRN

## 2013-10-04 NOTE — Telephone Encounter (Signed)
Ok to change to Viagra 100 mg one daily prn #6 with one year refills

## 2013-10-04 NOTE — Telephone Encounter (Signed)
Rx changed and RX given to patient.

## 2013-10-04 NOTE — Telephone Encounter (Signed)
Pt would like to change his tadalafil (CIALIS) 20 MG tablet to viagra.  Pt can get this med at a cheaper price, but needs the rx to take to the pharm. Can he pu a rx for viagra?

## 2013-10-04 NOTE — Telephone Encounter (Signed)
Is it okay to change?

## 2013-10-08 ENCOUNTER — Telehealth: Payer: Self-pay | Admitting: Family Medicine

## 2013-10-08 NOTE — Telephone Encounter (Signed)
Pt needs to charge viagra 100 mg to  new rx viagra 20 mg#50 [facility] to [facility] [place] [phone]. Pt can get 50 pills for 80 dollars. Pt can not afford viagra 100 mg

## 2013-10-09 MED ORDER — SILDENAFIL CITRATE 20 MG PO TABS: 20.0000 mg | ORAL_TABLET | Freq: Every day | ORAL | Status: DC

## 2013-10-09 NOTE — Telephone Encounter (Signed)
Last visit 09/02/13.

## 2013-10-09 NOTE — Telephone Encounter (Signed)
Rx changed and new Rx sent to pharmacy

## 2013-10-09 NOTE — Telephone Encounter (Signed)
Ok to change

## 2014-01-21 ENCOUNTER — Telehealth: Payer: Self-pay | Admitting: Family Medicine

## 2014-01-21 MED ORDER — SILDENAFIL CITRATE 20 MG PO TABS: 20.0000 mg | ORAL_TABLET | Freq: Every day | ORAL | Status: DC

## 2014-01-21 NOTE — Telephone Encounter (Signed)
Pt request refill sildenafil (REVATIO) 20 MG tablet   90 day refill  [facility]

## 2014-01-21 NOTE — Telephone Encounter (Signed)
RX sent to pharmacy  

## 2014-03-17 ENCOUNTER — Other Ambulatory Visit: Payer: Self-pay | Admitting: Family Medicine

## 2014-04-25 ENCOUNTER — Other Ambulatory Visit: Payer: Self-pay | Admitting: Family Medicine

## 2014-10-19 ENCOUNTER — Other Ambulatory Visit: Payer: Self-pay | Admitting: Family Medicine

## 2014-11-14 ENCOUNTER — Other Ambulatory Visit: Payer: Self-pay | Admitting: Family Medicine

## 2014-11-19 ENCOUNTER — Other Ambulatory Visit: Payer: Self-pay | Admitting: Family Medicine

## 2014-11-20 ENCOUNTER — Telehealth: Payer: Self-pay | Admitting: Family Medicine

## 2014-11-20 MED ORDER — PANTOPRAZOLE SODIUM 40 MG PO TBEC: 40.0000 mg | DELAYED_RELEASE_TABLET | Freq: Every day | ORAL | Status: DC

## 2014-11-20 NOTE — Telephone Encounter (Signed)
Rx sent to pharmacy   

## 2014-11-20 NOTE — Telephone Encounter (Signed)
Pt request refill pantoprazole (PROTONIX) 40 MG tablet Pt has made appt for n6/7 and promises to come.  Can you refill 30 days? Pt aware 30 day was sent in last mo, but he never received message about needing appt [facility] [address]

## 2014-11-25 ENCOUNTER — Encounter: Payer: Self-pay | Admitting: Family Medicine

## 2014-11-25 ENCOUNTER — Ambulatory Visit (INDEPENDENT_AMBULATORY_CARE_PROVIDER_SITE_OTHER): Payer: 59 | Admitting: Family Medicine

## 2014-11-25 VITALS — BP 130/80 | HR 80 | Temp 98.3°F | Wt 230.0 lb

## 2014-11-25 DIAGNOSIS — B351 Tinea unguium: Secondary | ICD-10-CM

## 2014-11-25 DIAGNOSIS — N529 Male erectile dysfunction, unspecified: Secondary | ICD-10-CM | POA: Diagnosis not present

## 2014-11-25 DIAGNOSIS — K219 Gastro-esophageal reflux disease without esophagitis: Secondary | ICD-10-CM | POA: Diagnosis not present

## 2014-11-25 MED ORDER — PANTOPRAZOLE SODIUM 40 MG PO TBEC: 40.0000 mg | DELAYED_RELEASE_TABLET | Freq: Every day | ORAL | Status: DC

## 2014-11-25 NOTE — Progress Notes (Signed)
   Subjective:    Patient ID: Keith Neal, male    DOB: [birth year], 59 y.o.   MRN: [id number]  HPI  Patient here to discuss multiple issues  Long history of GERD. He takes Protonix 40 mg daily and symptoms are well-controlled when he takes medication. No recent dysphagia. No recent appetite or weight changes. He is also conscious to modify diet. He does still smoke about one pack of cigarettes per day. No recent chest pains.  Thickening changes left great toenail. Occasional pain. No recent injury. Remote history of fracture that toe.  Erectile dysfunction. He has taken Viagra with success in the past. Requesting samples. No history of CAD.  Past Medical History  Diagnosis Date  . Obesity   . Dyslipidemia   . Tobacco user   . ED (erectile dysfunction)   . Hyperlipemia   . Arthritis     hands  . GERD (gastroesophageal reflux disease)     occ tums   Past Surgical History  Procedure Laterality Date  . Cholecystectomy  2009  . Foreign body removal Right 09/07/2012    Procedure: REMOVAL FOREIGN BODY EXTREMITY;  Surgeon: Tami RibasKevin R Kuzma, MD;  Location: [facility];  Service: Orthopedics;  Laterality: Right;    reports that he has been smoking Cigarettes.  He has a 30 pack-year smoking history. He does not have any smokeless tobacco history on file. He reports that he drinks alcohol. He reports that he does not use illicit drugs. family history includes Arthritis in his other; Cancer in his mother; Diabetes in his father and mother; Hypertension in his father and mother. No Known Allergies   Review of Systems  Constitutional: Negative for fatigue and unexpected weight change.  Eyes: Negative for visual disturbance.  Respiratory: Negative for cough, chest tightness and shortness of breath.   Cardiovascular: Negative for chest pain, palpitations and leg swelling.  Endocrine: Negative for polydipsia and polyuria.  Neurological: Negative for dizziness, syncope, weakness,  light-headedness and headaches.       Objective:   Physical Exam  Constitutional: He appears well-developed and well-nourished. No distress.  Neck: Neck supple. No JVD present.  Cardiovascular: Normal rate and regular rhythm.   Pulmonary/Chest: Effort normal and breath sounds normal. No respiratory distress. He has no wheezes. He has no rales.  Musculoskeletal: He exhibits no edema.  Skin:  Left great toe reveals thickened but no brittle changes  Psychiatric: He has a normal mood and affect. His behavior is normal.          Assessment & Plan:  #1 GERD. Controlled with medication. Refill Protonix for one year #2 erectile dysfunction. Samples of Viagra 100 mg one half to one tablet daily as needed #3 dystrophic toenail left great toe. Probable onychomycosis. We discussed options including culture and possible treatment Lamisil but this point he wishes to observe

## 2014-11-25 NOTE — Patient Instructions (Signed)
Smoking Cessation Quitting smoking is important to your health and has many advantages. However, it is not always easy to quit since nicotine is a very addictive drug. Oftentimes, people try 3 times or more before being able to quit. This document explains the best ways for you to prepare to quit smoking. Quitting takes hard work and a lot of effort, but you can do it. ADVANTAGES OF QUITTING SMOKING  You will live longer, feel better, and live better.  Your body will feel the impact of quitting smoking almost immediately.  Within 20 minutes, blood pressure decreases. Your pulse returns to its normal level.  After 8 hours, carbon monoxide levels in the blood return to normal. Your oxygen level increases.  After 24 hours, the chance of having a heart attack starts to decrease. Your breath, hair, and body stop smelling like smoke.  After 48 hours, damaged nerve endings begin to recover. Your sense of taste and smell improve.  After 72 hours, the body is virtually free of nicotine. Your bronchial tubes relax and breathing becomes easier.  After 2 to 12 weeks, lungs can hold more air. Exercise becomes easier and circulation improves.  The risk of having a heart attack, stroke, cancer, or lung disease is greatly reduced.  After 1 year, the risk of coronary heart disease is cut in half.  After 5 years, the risk of stroke falls to the same as a nonsmoker.  After 10 years, the risk of lung cancer is cut in half and the risk of other cancers decreases significantly.  After 15 years, the risk of coronary heart disease drops, usually to the level of a nonsmoker.  If you are pregnant, quitting smoking will improve your chances of having a healthy baby.  The people you live with, especially any children, will be healthier.  You will have extra money to spend on things other than cigarettes. QUESTIONS TO THINK ABOUT BEFORE ATTEMPTING TO QUIT You may want to talk about your answers with your  health care provider.  Why do you want to quit?  If you tried to quit in the past, what helped and what did not?  What will be the most difficult situations for you after you quit? How will you plan to handle them?  Who can help you through the tough times? Your family? Friends? A health care provider?  What pleasures do you get from smoking? What ways can you still get pleasure if you quit? Here are some questions to ask your health care provider:  How can you help me to be successful at quitting?  What medicine do you think would be best for me and how should I take it?  What should I do if I need more help?  What is smoking withdrawal like? How can I get information on withdrawal? GET READY  Set a quit date.  Change your environment by getting rid of all cigarettes, ashtrays, matches, and lighters in your home, car, or work. Do not let people smoke in your home.  Review your past attempts to quit. Think about what worked and what did not. GET SUPPORT AND ENCOURAGEMENT You have a better chance of being successful if you have help. You can get support in many ways.  Tell your family, friends, and coworkers that you are going to quit and need their support. Ask them not to smoke around you.  Get individual, group, or telephone counseling and support. Programs are available at local hospitals and health centers. Call   your local health department for information about programs in your area.  Spiritual beliefs and practices may help some smokers quit.  Download a "quit meter" on your computer to keep track of quit statistics, such as how long you have gone without smoking, cigarettes not smoked, and money saved.  Get a self-help book about quitting smoking and staying off tobacco. LEARN NEW SKILLS AND BEHAVIORS  Distract yourself from urges to smoke. Talk to someone, go for a walk, or occupy your time with a task.  Change your normal routine. Take a different route to work.  Drink tea instead of coffee. Eat breakfast in a different place.  Reduce your stress. Take a hot bath, exercise, or read a book.  Plan something enjoyable to do every day. Reward yourself for not smoking.  Explore interactive web-based programs that specialize in helping you quit. GET MEDICINE AND USE IT CORRECTLY Medicines can help you stop smoking and decrease the urge to smoke. Combining medicine with the above behavioral methods and support can greatly increase your chances of successfully quitting smoking.  Nicotine replacement therapy helps deliver nicotine to your body without the negative effects and risks of smoking. Nicotine replacement therapy includes nicotine gum, lozenges, inhalers, nasal sprays, and skin patches. Some may be available over-the-counter and others require a prescription.  Antidepressant medicine helps people abstain from smoking, but how this works is unknown. This medicine is available by prescription.  Nicotinic receptor partial agonist medicine simulates the effect of nicotine in your brain. This medicine is available by prescription. Ask your health care provider for advice about which medicines to use and how to use them based on your health history. Your health care provider will tell you what side effects to look out for if you choose to be on a medicine or therapy. Carefully read the information on the package. Do not use any other product containing nicotine while using a nicotine replacement product.  RELAPSE OR DIFFICULT SITUATIONS Most relapses occur within the first 3 months after quitting. Do not be discouraged if you start smoking again. Remember, most people try several times before finally quitting. You may have symptoms of withdrawal because your body is used to nicotine. You may crave cigarettes, be irritable, feel very hungry, cough often, get headaches, or have difficulty concentrating. The withdrawal symptoms are only temporary. They are strongest  when you first quit, but they will go away within 10-14 days. To reduce the chances of relapse, try to:  Avoid drinking alcohol. Drinking lowers your chances of successfully quitting.  Reduce the amount of caffeine you consume. Once you quit smoking, the amount of caffeine in your body increases and can give you symptoms, such as a rapid heartbeat, sweating, and anxiety.  Avoid smokers because they can make you want to smoke.  Do not let weight gain distract you. Many smokers will gain weight when they quit, usually less than 10 pounds. Eat a healthy diet and stay active. You can always lose the weight gained after you quit.  Find ways to improve your mood other than smoking. FOR MORE INFORMATION  www.smokefree.gov  Document Released: 05/31/2001 Document Revised: 10/21/2013 Document Reviewed: 09/15/2011 Exit[employer] Patient Information 2015 [employer]. This information is not intended to replace advice given to you by your health care provider. Make sure you discuss any questions you have with your health care provider.  

## 2014-11-25 NOTE — Progress Notes (Signed)
Pre visit review using our clinic review tool, if applicable. No additional management support is needed unless otherwise documented below in the visit note. 

## 2015-01-19 ENCOUNTER — Telehealth: Payer: Self-pay | Admitting: Family Medicine

## 2015-01-19 NOTE — Telephone Encounter (Signed)
Pt would like a refill on alprazolam [facility] [place]/

## 2015-01-19 NOTE — Telephone Encounter (Signed)
Last visit 11/25/14 Last refill 01/29/13 #30 0 refill

## 2015-01-20 MED ORDER — ALPRAZOLAM 0.5 MG PO TABS: 0.5000 mg | ORAL_TABLET | Freq: Every evening | ORAL | Status: DC | PRN

## 2015-01-20 NOTE — Telephone Encounter (Signed)
Refill once.  Avoid regular use. 

## 2015-01-20 NOTE — Telephone Encounter (Signed)
Rx called into the pharmacy. 

## 2015-02-13 ENCOUNTER — Ambulatory Visit: Payer: Self-pay | Admitting: Family Medicine

## 2015-02-16 ENCOUNTER — Ambulatory Visit: Payer: Self-pay | Admitting: Family Medicine

## 2015-03-30 IMAGING — CR DG CHEST 2V
2 series · 2 of 2 positions shown · non-contrast
Comparison: July 09, 2007.

CLINICAL DATA: Hemoptysis.

EXAM:
CHEST  2 VIEW

[view not recorded (1 of 2)]
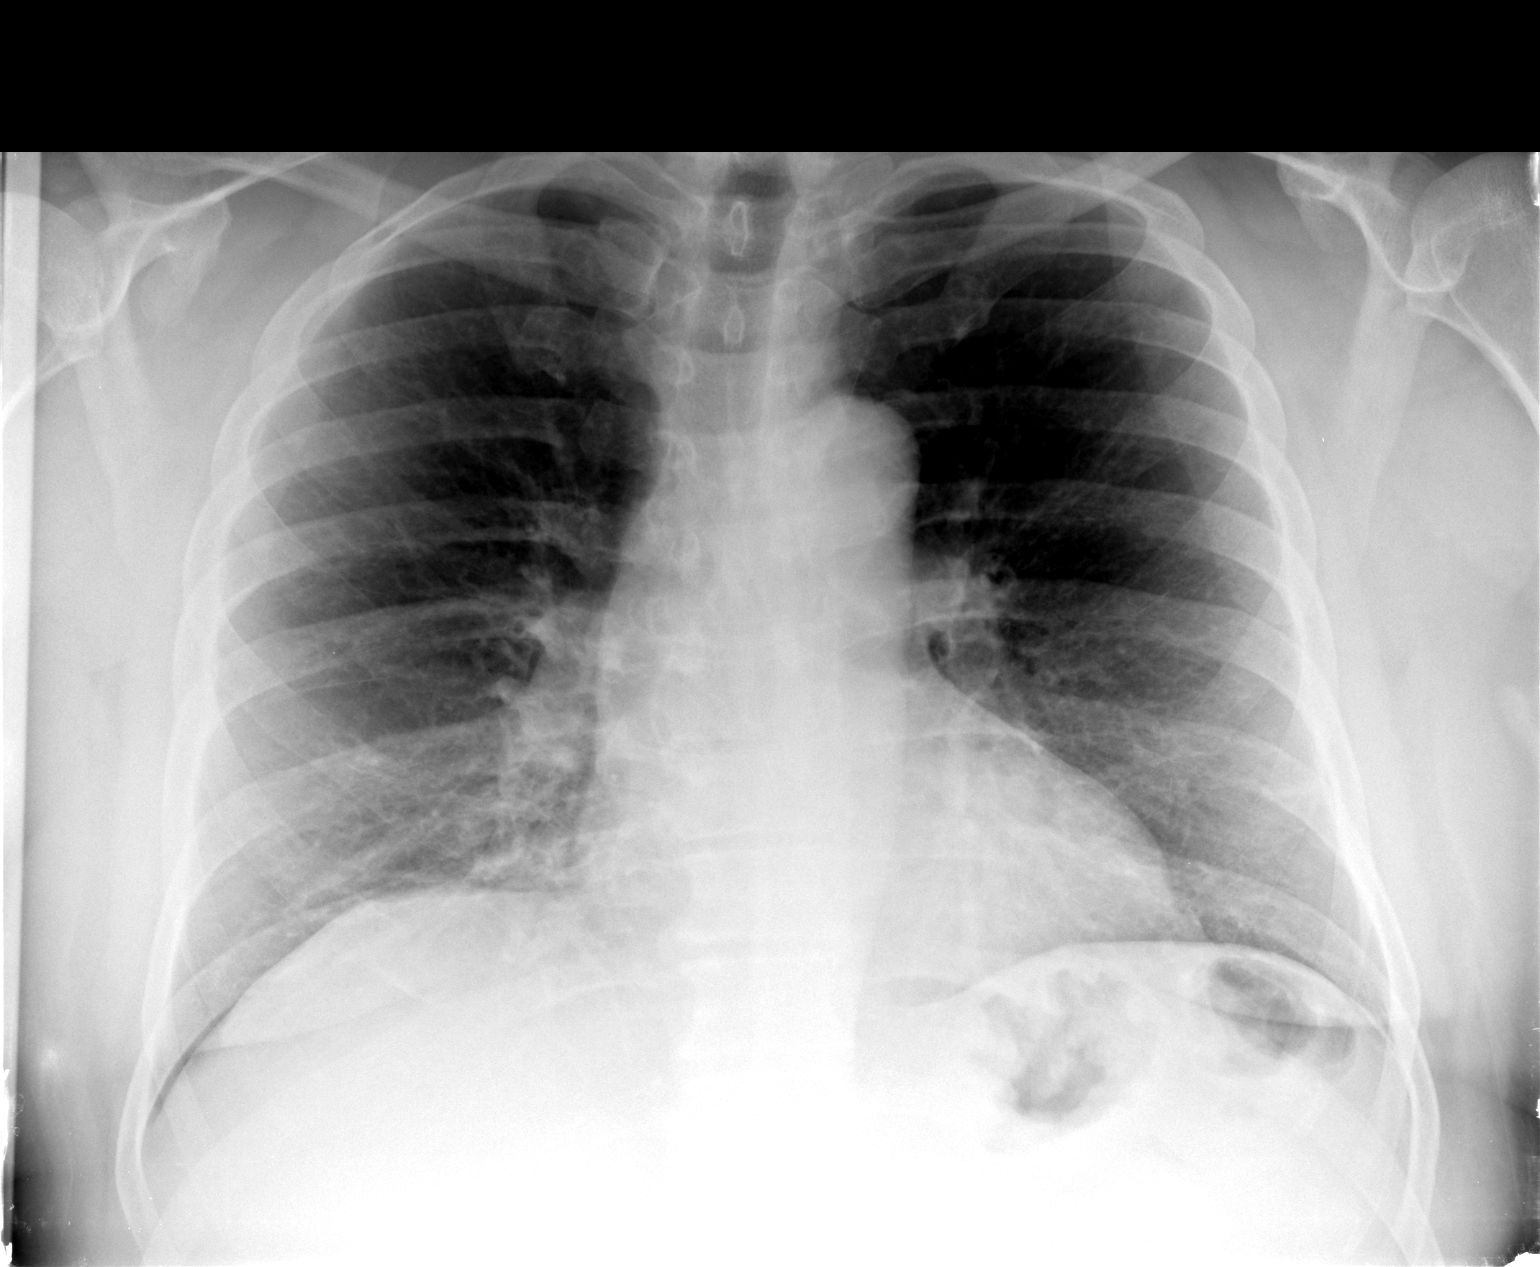

[view not recorded (2 of 2)]
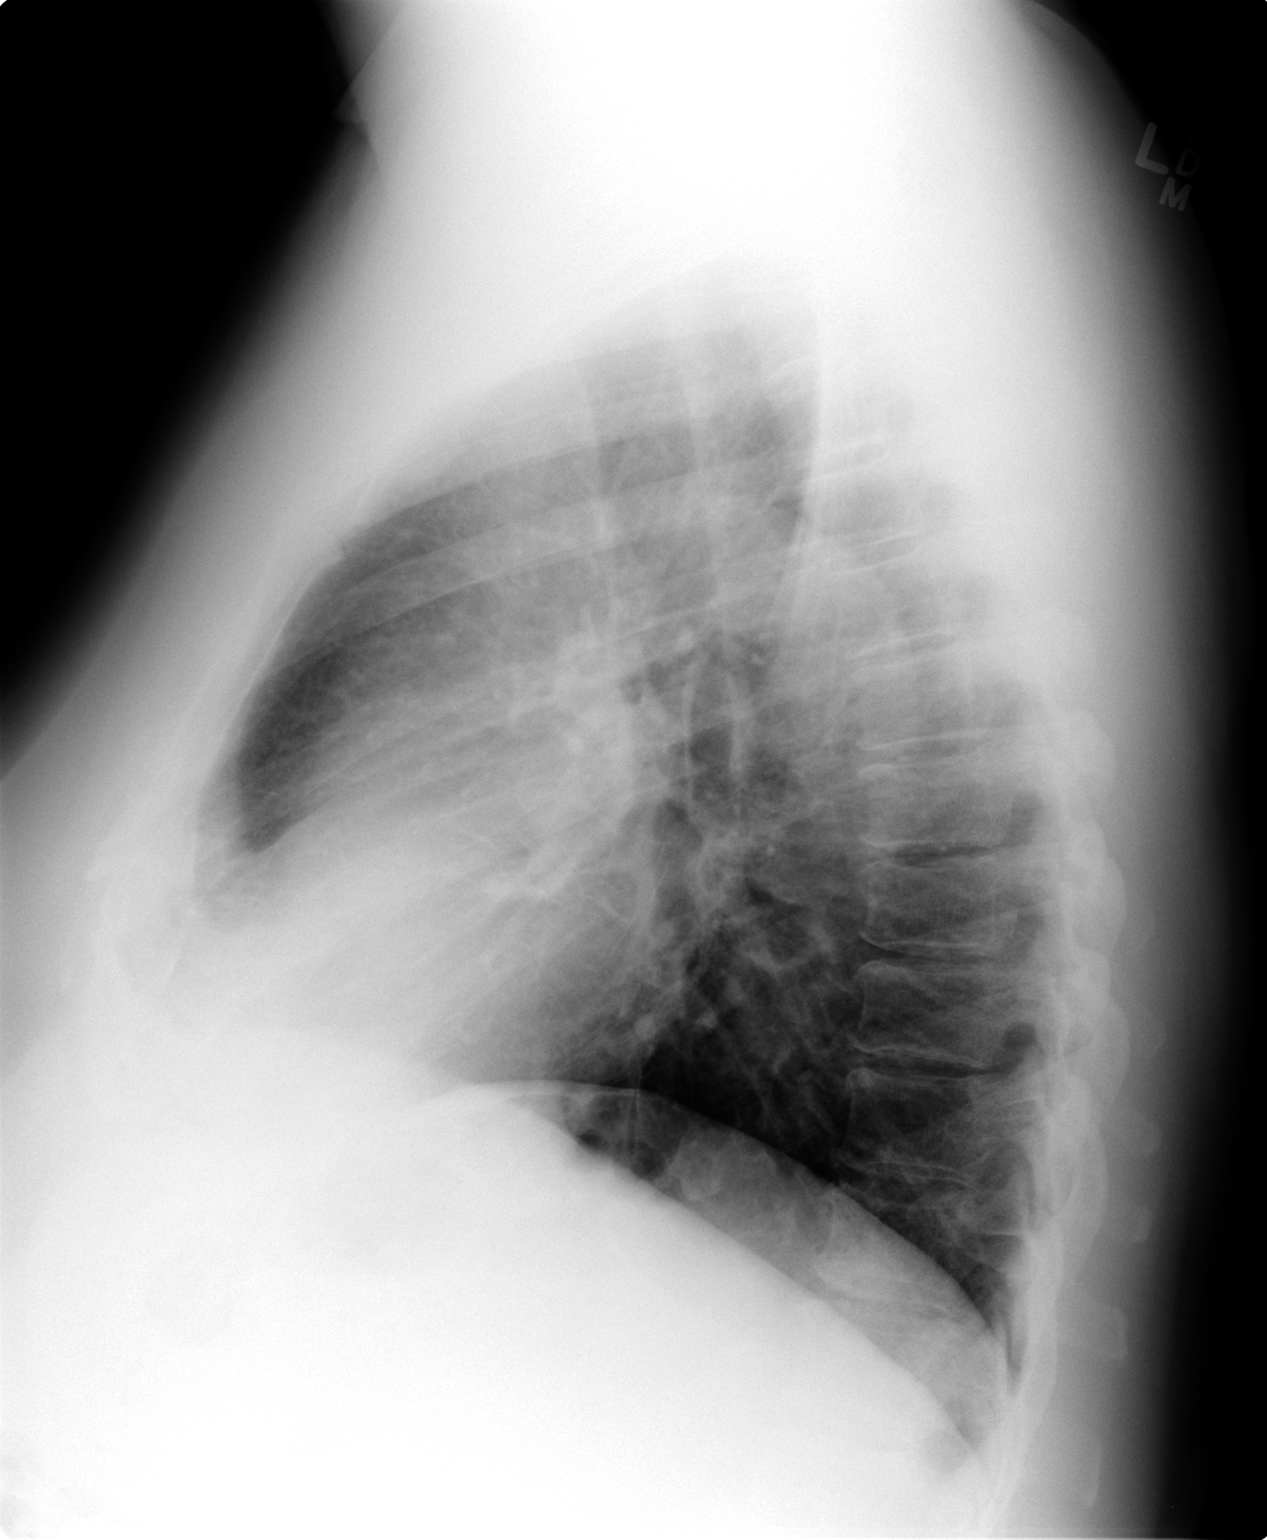

[2 of 2 positions shown; findings below may reference images not displayed]

FINDINGS: The heart size and mediastinal contours are within normal limits.
Both lungs are clear. The visualized skeletal structures are
unremarkable.
IMPRESSION: No active cardiopulmonary disease.

## 2015-06-03 ENCOUNTER — Encounter: Payer: Self-pay | Admitting: Family Medicine

## 2015-06-03 ENCOUNTER — Ambulatory Visit (INDEPENDENT_AMBULATORY_CARE_PROVIDER_SITE_OTHER): Payer: 59 | Admitting: Family Medicine

## 2015-06-03 VITALS — BP 150/100 | HR 108 | Temp 98.3°F | Resp 16 | Ht 69.0 in | Wt 239.7 lb

## 2015-06-03 DIAGNOSIS — R0981 Nasal congestion: Secondary | ICD-10-CM | POA: Diagnosis not present

## 2015-06-03 MED ORDER — AMOXICILLIN-POT CLAVULANATE 875-125 MG PO TABS: 1.0000 | ORAL_TABLET | Freq: Two times a day (BID) | ORAL | Status: DC

## 2015-06-03 NOTE — Patient Instructions (Signed)
Stay well hydrated Continue with mucinex and nasal saline Be cautious with sudafed secondary to high blood pressure.  If you develop any fever or pus like nasal discharge or symptoms not improving over next few days start the Antibiotic.

## 2015-06-03 NOTE — Progress Notes (Signed)
   Subjective:    Patient ID: Keith PlowmanRandy E Neal, male    DOB: June 15, 1956, 59 y.o.   MRN: [id number]  HPI Patient seen with sinusitis symptoms Onset about 4-5 days ago. Mostly by frontal sinus pressure No fever. No colored nasal discharge. Intermittent headaches. Some increased malaise Has taken Sudafed and Mucinex along with Advil with some relief. No cough.  No history of hypertension.  Past Medical History  Diagnosis Date  . Obesity   . Dyslipidemia   . Tobacco user   . ED (erectile dysfunction)   . Hyperlipemia   . Arthritis     hands  . GERD (gastroesophageal reflux disease)     occ tums   Past Surgical History  Procedure Laterality Date  . Cholecystectomy  2009  . Foreign body removal Right 09/07/2012    Procedure: REMOVAL FOREIGN BODY EXTREMITY;  Surgeon: Tami RibasKevin R Kuzma, MD;  Location: [facility];  Service: Orthopedics;  Laterality: Right;    reports that he has been smoking Cigarettes.  He has a 30 pack-year smoking history. He does not have any smokeless tobacco history on file. He reports that he drinks alcohol. He reports that he does not use illicit drugs. family history includes Arthritis in his other; Cancer in his mother; Diabetes in his father and mother; Hypertension in his father and mother. No Known Allergies    Review of Systems  Constitutional: Positive for fatigue. Negative for fever and chills.  HENT: Positive for congestion and sinus pressure. Negative for ear discharge, ear pain, postnasal drip and sore throat.   Neurological: Positive for headaches.       Objective:   Physical Exam  Constitutional: He appears well-developed and well-nourished.  HENT:  Right Ear: External ear normal.  Left Ear: External ear normal.  Nose: Nose normal.  Mouth/Throat: Oropharynx is clear and moist.  Neck: Neck supple.  Cardiovascular: Normal rate and regular rhythm.   Pulmonary/Chest: Effort normal and breath sounds normal. No respiratory distress.  He has no wheezes. He has no rales.  Lymphadenopathy:    He has no cervical adenopathy.          Assessment & Plan:  Sinus congestion. We recommend continue Mucinex and increase hydration. Cautious use of Sudafed with elevated blood pressure. This is likely elevating his blood pressure somewhat today. Continue Advil as needed. Prescription for Augmentin no would not start unless he has worsening or persistent symptoms

## 2015-11-08 ENCOUNTER — Other Ambulatory Visit: Payer: Self-pay | Admitting: Family Medicine

## 2015-11-09 ENCOUNTER — Telehealth: Payer: Self-pay | Admitting: Family Medicine

## 2015-11-09 MED ORDER — PANTOPRAZOLE SODIUM 40 MG PO TBEC: 40.0000 mg | DELAYED_RELEASE_TABLET | Freq: Every day | ORAL | Status: DC

## 2015-11-09 MED ORDER — ALPRAZOLAM 0.5 MG PO TABS: 0.5000 mg | ORAL_TABLET | Freq: Every evening | ORAL | Status: DC | PRN

## 2015-11-09 NOTE — Telephone Encounter (Signed)
Both medications filled for patient.

## 2015-11-09 NOTE — Telephone Encounter (Signed)
Pt need new Rx for alprazolam Prudy Feeler(XANAX) and pantoprazole (PROTONIX).  Pharm:  [facility]

## 2015-12-02 ENCOUNTER — Other Ambulatory Visit (INDEPENDENT_AMBULATORY_CARE_PROVIDER_SITE_OTHER): Payer: 59

## 2015-12-02 DIAGNOSIS — Z0000 Encounter for general adult medical examination without abnormal findings: Secondary | ICD-10-CM

## 2015-12-02 LAB — CBC WITH DIFFERENTIAL/PLATELET
BASOS ABS: 0.1 10*3/uL (ref 0.0–0.1)
BASOS PCT: 0.6 % (ref 0.0–3.0)
EOS ABS: 0.1 10*3/uL (ref 0.0–0.7)
Eosinophils Relative: 1.3 % (ref 0.0–5.0)
HEMATOCRIT: 46.3 % (ref 39.0–52.0)
HEMOGLOBIN: 15.8 g/dL (ref 13.0–17.0)
LYMPHS PCT: 29.1 % (ref 12.0–46.0)
Lymphs Abs: 2.6 10*3/uL (ref 0.7–4.0)
MCHC: 34.2 g/dL (ref 30.0–36.0)
MCV: 94.1 fl (ref 78.0–100.0)
MONO ABS: 0.8 10*3/uL (ref 0.1–1.0)
Monocytes Relative: 9.1 % (ref 3.0–12.0)
Neutro Abs: 5.3 10*3/uL (ref 1.4–7.7)
Neutrophils Relative %: 59.9 % (ref 43.0–77.0)
Platelets: 284 10*3/uL (ref 150.0–400.0)
RBC: 4.92 Mil/uL (ref 4.22–5.81)
RDW: 13.4 % (ref 11.5–15.5)
WBC: 8.8 10*3/uL (ref 4.0–10.5)

## 2015-12-02 LAB — PSA: PSA: 1.39 ng/mL (ref 0.10–4.00)

## 2015-12-02 LAB — BASIC METABOLIC PANEL
BUN: 12 mg/dL (ref 6–23)
CHLORIDE: 99 meq/L (ref 96–112)
CO2: 28 mEq/L (ref 19–32)
CREATININE: 0.97 mg/dL (ref 0.40–1.50)
Calcium: 9.7 mg/dL (ref 8.4–10.5)
GFR: 83.29 mL/min (ref >60.00)
Glucose, Bld: 261 mg/dL — ABNORMAL HIGH (ref 70–99)
POTASSIUM: 4.6 meq/L (ref 3.5–5.1)
Sodium: 135 mEq/L (ref 135–145)

## 2015-12-02 LAB — LIPID PANEL
CHOL/HDL RATIO: 5
Cholesterol: 182 mg/dL (ref 0–200)
HDL: 39.4 mg/dL (ref >39.00)
LDL CALC: 121 mg/dL — AB (ref 0–99)
NonHDL: 142.4
Triglycerides: 107 mg/dL (ref 0.0–149.0)
VLDL: 21.4 mg/dL (ref 0.0–40.0)

## 2015-12-02 LAB — HEPATIC FUNCTION PANEL
ALK PHOS: 65 U/L (ref 39–117)
ALT: 14 U/L (ref 0–53)
AST: 10 U/L (ref 0–37)
Albumin: 4.4 g/dL (ref 3.5–5.2)
BILIRUBIN DIRECT: 0.2 mg/dL (ref 0.0–0.3)
BILIRUBIN TOTAL: 0.6 mg/dL (ref 0.2–1.2)
TOTAL PROTEIN: 7 g/dL (ref 6.0–8.3)

## 2015-12-02 LAB — TSH: TSH: 1.44 u[IU]/mL (ref 0.35–4.50)

## 2015-12-07 ENCOUNTER — Encounter: Payer: Self-pay | Admitting: Family Medicine

## 2015-12-07 ENCOUNTER — Ambulatory Visit (INDEPENDENT_AMBULATORY_CARE_PROVIDER_SITE_OTHER): Payer: 59 | Admitting: Family Medicine

## 2015-12-07 VITALS — BP 148/100 | HR 102 | Temp 98.1°F | Ht 69.0 in | Wt 228.0 lb

## 2015-12-07 DIAGNOSIS — R739 Hyperglycemia, unspecified: Secondary | ICD-10-CM | POA: Diagnosis not present

## 2015-12-07 DIAGNOSIS — K130 Diseases of lips: Secondary | ICD-10-CM | POA: Diagnosis not present

## 2015-12-07 DIAGNOSIS — IMO0001 Reserved for inherently not codable concepts without codable children: Secondary | ICD-10-CM

## 2015-12-07 DIAGNOSIS — Z0000 Encounter for general adult medical examination without abnormal findings: Secondary | ICD-10-CM

## 2015-12-07 DIAGNOSIS — R030 Elevated blood-pressure reading, without diagnosis of hypertension: Secondary | ICD-10-CM

## 2015-12-07 LAB — POCT GLYCOSYLATED HEMOGLOBIN (HGB A1C): Hemoglobin A1C: 10.8

## 2015-12-07 MED ORDER — CLOTRIMAZOLE-BETAMETHASONE 1-0.05 % EX CREA: 1.0000 "application " | TOPICAL_CREAM | Freq: Two times a day (BID) | CUTANEOUS | Status: DC

## 2015-12-07 MED ORDER — METFORMIN HCL 500 MG PO TABS: 500.0000 mg | ORAL_TABLET | Freq: Two times a day (BID) | ORAL | Status: DC

## 2015-12-07 NOTE — Progress Notes (Signed)
Subjective:    Patient ID: Keith Neal, male    DOB: 1955/07/30, 60 y.o.   MRN: [id number]  HPI  Patient here for physical. Increased recent stress with breakup from fianc. He has been eating very poorly and not exercising much. Has never had screening colonoscopy. No history of shingles vaccine. Tetanus booster due in 1 year.  Still smokes less than one half pack cigarettes per day. History of obesity. History of GERD. Erectile dysfunction. Positive family history of type 2 diabetes but patient has never been diagnosed. He has recently had some mild blurred vision along with polyuria and mild increased thirst. Weight stable. No prior history of diabetes. Does have positive family history of type 2 diabetes-mother.  He's had several weeks of some irritation left corner of his mouth. Mild soreness. He's not tried any topical creams.  Past Medical History  Diagnosis Date  . Obesity   . Dyslipidemia   . Tobacco user   . ED (erectile dysfunction)   . Hyperlipemia   . Arthritis     hands  . GERD (gastroesophageal reflux disease)     occ tums   Past Surgical History  Procedure Laterality Date  . Cholecystectomy  2009  . Foreign body removal Right 09/07/2012    Procedure: REMOVAL FOREIGN BODY EXTREMITY;  Surgeon: Tami Ribas, MD;  Location: [facility];  Service: Orthopedics;  Laterality: Right;    reports that he has been smoking Cigarettes.  He has a 30 pack-year smoking history. He does not have any smokeless tobacco history on file. He reports that he drinks alcohol. He reports that he does not use illicit drugs. family history includes Alzheimer's disease in his father; Arthritis in his other; Cancer in his mother; Diabetes in his father and mother; Hypertension in his father and mother. No Known Allergies    Review of Systems  Constitutional: Negative for fever, activity change, appetite change, fatigue and unexpected weight change.  HENT: Negative for  congestion, ear pain and trouble swallowing.   Eyes: Negative for pain and redness.  Respiratory: Negative for cough, shortness of breath and wheezing.   Cardiovascular: Negative for chest pain and palpitations.  Gastrointestinal: Negative for nausea, vomiting, abdominal pain, diarrhea, constipation, blood in stool, abdominal distention and rectal pain.  Endocrine: Positive for polydipsia and polyuria.  Genitourinary: Negative for dysuria, hematuria and testicular pain.  Musculoskeletal: Negative for joint swelling and arthralgias.  Skin: Negative for rash.  Neurological: Negative for dizziness, syncope, weakness and headaches.  Hematological: Negative for adenopathy.  Psychiatric/Behavioral: Negative for confusion and dysphoric mood.       Objective:   Physical Exam  Constitutional: He is oriented to person, place, and time. He appears well-developed and well-nourished. No distress.  HENT:  Head: Normocephalic and atraumatic.  Right Ear: External ear normal.  Left Ear: External ear normal.  He has a very small fissure corner of the left side of the mouth with minimal erythema.  Eyes: Conjunctivae and EOM are normal. Pupils are equal, round, and reactive to light.  Neck: Normal range of motion. Neck supple. No thyromegaly present.  Cardiovascular: Normal rate, regular rhythm and normal heart sounds.   No murmur heard. Pulmonary/Chest: No respiratory distress. He has no wheezes. He has no rales.  Abdominal: Soft. Bowel sounds are normal. He exhibits no distension and no mass. There is no tenderness. There is no rebound and no guarding.  Musculoskeletal: He exhibits no edema.  Lymphadenopathy:    He has  no cervical adenopathy.  Neurological: He is alert and oriented to person, place, and time. He displays normal reflexes. No cranial nerve deficit.  Skin: No rash noted.  Psychiatric: He has a normal mood and affect.          Assessment & Plan:  #1 physical exam. Check on  coverage for shingles vaccine. Schedule screening colonoscopy. He is encouraged to lose weight. Tetanus booster by next year. Labs reviewed. Elevated fasting glucose 261-see below  #2 type 2 diabetes-new onset. Recent fasting blood sugar 261 and hemoglobin A1c here in office today 10.8. Patient is encouraged to lose weight. We have offered further diabetic education. Start metformin 500 milligrams twice a day. We discussed at some length importance of exercise and reducing sugars and starches. We'll plan follow-up in 6-8 weeks to reassess and bring home blood sugar readings back at that point to review  #3 angular cheilitis left side of mouth-Lotrisone cream twice daily. Touch base in 2 weeks if not improved  #4 elevated blood pressure. Not currently treated for hypertension. Monitor closely over the next few weeks. If still up at next visit consider medication such as ACE inhibitor  Kristian CoveyBruce W Austin Pongratz MD [facility]

## 2015-12-07 NOTE — Patient Instructions (Signed)

## 2015-12-07 NOTE — Progress Notes (Signed)
Pre visit review using our clinic review tool, if applicable. No additional management support is needed unless otherwise documented below in the visit note. 

## 2015-12-14 ENCOUNTER — Other Ambulatory Visit: Payer: Self-pay | Admitting: Family Medicine

## 2015-12-14 MED ORDER — ALPRAZOLAM 0.5 MG PO TABS: 0.5000 mg | ORAL_TABLET | Freq: Every evening | ORAL | Status: AC | PRN

## 2015-12-14 MED ORDER — ALPRAZOLAM 0.5 MG PO TABS: 0.5000 mg | ORAL_TABLET | Freq: Every evening | ORAL | Status: DC | PRN

## 2015-12-14 NOTE — Telephone Encounter (Signed)
Last refill was 11/09/2015 #30 Last seen 12/07/2015 Please advise on refill

## 2015-12-14 NOTE — Telephone Encounter (Signed)
Done

## 2015-12-14 NOTE — Telephone Encounter (Signed)
Verbally called in for patient.  

## 2015-12-14 NOTE — Telephone Encounter (Signed)
Recommend avoid regular use.  Refill #20.

## 2015-12-14 NOTE — Telephone Encounter (Signed)
Pt need new Rx for alprazolam   °

## 2015-12-28 ENCOUNTER — Other Ambulatory Visit: Payer: Self-pay | Admitting: Family Medicine

## 2016-02-08 ENCOUNTER — Ambulatory Visit (INDEPENDENT_AMBULATORY_CARE_PROVIDER_SITE_OTHER): Payer: 59 | Admitting: Family Medicine

## 2016-02-08 ENCOUNTER — Encounter: Payer: Self-pay | Admitting: Family Medicine

## 2016-02-08 VITALS — BP 140/86 | HR 83 | Temp 97.8°F | Ht 69.0 in | Wt 229.0 lb

## 2016-02-08 DIAGNOSIS — R030 Elevated blood-pressure reading, without diagnosis of hypertension: Secondary | ICD-10-CM | POA: Diagnosis not present

## 2016-02-08 DIAGNOSIS — IMO0001 Reserved for inherently not codable concepts without codable children: Secondary | ICD-10-CM

## 2016-02-08 DIAGNOSIS — IMO0002 Reserved for concepts with insufficient information to code with codable children: Secondary | ICD-10-CM | POA: Insufficient documentation

## 2016-02-08 DIAGNOSIS — E1165 Type 2 diabetes mellitus with hyperglycemia: Secondary | ICD-10-CM | POA: Diagnosis not present

## 2016-02-08 NOTE — Progress Notes (Signed)
Subjective:     Patient ID: Keith PlowmanRandy E Neal, male   DOB: 1956/05/16, 60 y.o.   MRN: [id number]  HPI Follow-up regarding type 2 diabetes and elevated blood pressure. Patient was here for recent physical had A1c 10.8%. We started metformin 500 mg twice daily. He has occasional loose stools but otherwise tolerating well. No polyuria or polydipsia. He has not been checking his blood sugars. Also had recent slightly elevated blood pressure. He is trying to make some dietary changes. No consistent exercise. Still smoking.  Past Medical History:  Diagnosis Date  . Arthritis    hands  . Dyslipidemia   . ED (erectile dysfunction)   . GERD (gastroesophageal reflux disease)    occ tums  . Hyperlipemia   . Obesity   . Tobacco user    Past Surgical History:  Procedure Laterality Date  . CHOLECYSTECTOMY  2009  . FOREIGN BODY REMOVAL Right 09/07/2012   Procedure: REMOVAL FOREIGN BODY EXTREMITY;  Surgeon: Tami RibasKevin R Kuzma, MD;  Location: [facility];  Service: Orthopedics;  Laterality: Right;    reports that he has been smoking Cigarettes.  He has a 30.00 pack-year smoking history. He does not have any smokeless tobacco history on file. He reports that he drinks alcohol. He reports that he does not use drugs. family history includes Alzheimer's disease in his father; Arthritis in his other; Cancer in his mother; Diabetes in his father and mother; Hypertension in his father and mother. No Known Allergies]   Review of Systems  Constitutional: Negative for fatigue and unexpected weight change.  Eyes: Negative for visual disturbance.  Respiratory: Negative for cough, chest tightness and shortness of breath.   Cardiovascular: Negative for chest pain, palpitations and leg swelling.  Endocrine: Negative for polydipsia and polyuria.  Neurological: Negative for dizziness, syncope, weakness, light-headedness and headaches.       Objective:   Physical Exam  Constitutional: He is oriented to  person, place, and time. He appears well-developed and well-nourished.  HENT:  Right Ear: External ear normal.  Left Ear: External ear normal.  Mouth/Throat: Oropharynx is clear and moist.  Eyes: Pupils are equal, round, and reactive to light.  Neck: Neck supple. No thyromegaly present.  Cardiovascular: Normal rate and regular rhythm.   Pulmonary/Chest: Effort normal and breath sounds normal. No respiratory distress. He has no wheezes. He has no rales.  Musculoskeletal: He exhibits no edema.  Neurological: He is alert and oriented to person, place, and time.       Assessment:     #1 type 2 diabetes. Recent diagnosis. Not monitoring blood sugars at home  #2 hypertension slightly improved today but still sub-optimal numbers    Plan:     -office follow-up in 3 months -Repeat A1c in one month -weight loss and carbohydrate reduction. -Titrate metformin further in one month if not improved  Kristian CoveyBruce W Johnnye Sandford MD [facility]

## 2016-02-08 NOTE — Progress Notes (Signed)
Pre visit review using our clinic review tool, if applicable. No additional management support is needed unless otherwise documented below in the visit note. 

## 2016-03-10 ENCOUNTER — Other Ambulatory Visit: Payer: Self-pay

## 2016-03-11 ENCOUNTER — Other Ambulatory Visit: Payer: Self-pay

## 2016-05-03 ENCOUNTER — Ambulatory Visit (INDEPENDENT_AMBULATORY_CARE_PROVIDER_SITE_OTHER): Payer: 59 | Admitting: Family Medicine

## 2016-05-03 ENCOUNTER — Encounter: Payer: Self-pay | Admitting: Family Medicine

## 2016-05-03 VITALS — BP 172/102 | HR 94 | Temp 98.4°F | Ht 69.0 in | Wt 233.0 lb

## 2016-05-03 DIAGNOSIS — E1165 Type 2 diabetes mellitus with hyperglycemia: Secondary | ICD-10-CM

## 2016-05-03 DIAGNOSIS — J069 Acute upper respiratory infection, unspecified: Secondary | ICD-10-CM | POA: Diagnosis not present

## 2016-05-03 DIAGNOSIS — E785 Hyperlipidemia, unspecified: Secondary | ICD-10-CM

## 2016-05-03 DIAGNOSIS — IMO0001 Reserved for inherently not codable concepts without codable children: Secondary | ICD-10-CM

## 2016-05-03 DIAGNOSIS — I10 Essential (primary) hypertension: Secondary | ICD-10-CM | POA: Diagnosis not present

## 2016-05-03 DIAGNOSIS — B9789 Other viral agents as the cause of diseases classified elsewhere: Secondary | ICD-10-CM

## 2016-05-03 MED ORDER — EMPAGLIFLOZIN 10 MG PO TABS
10.0000 mg | ORAL_TABLET | Freq: Every day | ORAL | 11 refills | Status: DC
Start: 2016-05-03 — End: 2017-05-26

## 2016-05-03 MED ORDER — VALSARTAN 80 MG PO TABS: 80.0000 mg | ORAL_TABLET | Freq: Every day | ORAL | 11 refills | Status: DC

## 2016-05-03 MED ORDER — METFORMIN HCL ER 750 MG PO TB24: 750.0000 mg | ORAL_TABLET | Freq: Every day | ORAL | 11 refills | Status: DC

## 2016-05-03 NOTE — Progress Notes (Signed)
Subjective:     Patient ID: Keith PlowmanRandy E Edds, male   DOB: December 12, 1955, 60 y.o.   MRN: [id number]  HPI Patient seen for follow-up regarding multiple issues. First, his had some recent sinus congestion for the past week or so. He's had some occasional productive cough. No fevers or chills. Some sinus pressure diffusely  Type 2 diabetes. We placed him on metformin 500 milligrams twice a day he has only been taking this once daily. He had some loose stools and occasional diarrhea. Not monitoring blood sugars. Poor compliance with diet and exercise  History of elevated blood pressures in the past but minimally elevated. Significantly more elevated today. No headaches. No chest pains.  History of dyslipidemia. He has been reluctant to take medications in general. Fortunately, still smoking about a half pack cigarettes per day.  Past Medical History:  Diagnosis Date  . Arthritis    hands  . Dyslipidemia   . ED (erectile dysfunction)   . GERD (gastroesophageal reflux disease)    occ tums  . Hyperlipemia   . Obesity   . Tobacco user    Past Surgical History:  Procedure Laterality Date  . CHOLECYSTECTOMY  2009  . FOREIGN BODY REMOVAL Right 09/07/2012   Procedure: REMOVAL FOREIGN BODY EXTREMITY;  Surgeon: Tami RibasKevin R Kuzma, MD;  Location: [facility];  Service: Orthopedics;  Laterality: Right;    reports that he has been smoking Cigarettes.  He has a 30.00 pack-year smoking history. He does not have any smokeless tobacco history on file. He reports that he drinks alcohol. He reports that he does not use drugs. family history includes Alzheimer's disease in his father; Arthritis in his other; Cancer in his mother; Diabetes in his father and mother; Hypertension in his father and mother. No Known Allergies   Review of Systems  Constitutional: Positive for fatigue. Negative for chills and fever.  HENT: Positive for congestion. Negative for sore throat.   Respiratory: Positive for cough.    Cardiovascular: Negative for chest pain.  Neurological: Negative for dizziness.       Objective:   Physical Exam  Constitutional: He appears well-developed and well-nourished.  HENT:  Right Ear: External ear normal.  Left Ear: External ear normal.  Mouth/Throat: Oropharynx is clear and moist.  Neck: Neck supple.  Cardiovascular: Normal rate and regular rhythm.   Pulmonary/Chest: Effort normal and breath sounds normal. No respiratory distress. He has no wheezes. He has no rales.  Musculoskeletal: He exhibits no edema.  Lymphadenopathy:    He has no cervical adenopathy.       Assessment:     #1 probable viral URI with cough. Nonfocal exam  #2 type 2 diabetes poorly controlled with A1c today 9.6%-this compares with 10.8% last June  #3 history of mild loose stools and diarrhea with metformin  #4 hypertension currently untreated  #5 dyslipidemia    Plan:     -Change metformin to extended release 750 mg once daily -Add Jardiance 10 mg once daily -Start valsartan 80 mg once daily and reassess blood pressure in one month -He is strongly encouraged to stop smoking -We discussed the importance of weight loss. We've recommended dietitian but he declines at this time. -We discussed the fact that statins are indicated for type II diabetics regardless of cholesterol level but at this point he is very reluctant. Will bring up for discussion again at follow-up  Kristian CoveyBruce W Burchette MD [facility]

## 2016-05-08 ENCOUNTER — Other Ambulatory Visit: Payer: Self-pay | Admitting: Family Medicine

## 2016-05-10 ENCOUNTER — Ambulatory Visit: Payer: Self-pay | Admitting: Family Medicine

## 2016-06-01 ENCOUNTER — Encounter: Payer: Self-pay | Admitting: Family Medicine

## 2016-06-01 ENCOUNTER — Ambulatory Visit (INDEPENDENT_AMBULATORY_CARE_PROVIDER_SITE_OTHER): Payer: 59 | Admitting: Family Medicine

## 2016-06-01 VITALS — BP 130/90 | HR 98 | Ht 69.0 in | Wt 226.8 lb

## 2016-06-01 DIAGNOSIS — E1165 Type 2 diabetes mellitus with hyperglycemia: Secondary | ICD-10-CM

## 2016-06-01 DIAGNOSIS — Z23 Encounter for immunization: Secondary | ICD-10-CM

## 2016-06-01 DIAGNOSIS — I10 Essential (primary) hypertension: Secondary | ICD-10-CM

## 2016-06-01 DIAGNOSIS — E785 Hyperlipidemia, unspecified: Secondary | ICD-10-CM | POA: Diagnosis not present

## 2016-06-01 DIAGNOSIS — IMO0001 Reserved for inherently not codable concepts without codable children: Secondary | ICD-10-CM

## 2016-06-01 NOTE — Progress Notes (Signed)
Subjective:     Patient ID: Keith Neal, male   DOB: [birth year], 60 y.o.   MRN: [id number]  HPI Patient seen for medical follow-up. History of obesity, type 2 diabetes, hypertension, dyslipidemia. Recent elevated blood pressure which had been very high -up around 170 systolic. Started valsartan and which he's taking without side effect. He was having some diarrhea with metformin and we changed to extended release and this has curtailed his diarrhea symptoms. His diabetes has been poorly controlled and we added Jardiance 10 mg once daily. He is tolerating without side effects. He has made some dietary changes. He's lost 7 pounds and feels well overall. Not monitoring blood pressures or blood sugars regularly.  Mild hyperlipidemia.  Not currently on statin.  No hx of CAD no chest pain.  Past Medical History:  Diagnosis Date  . Arthritis    hands  . Dyslipidemia   . ED (erectile dysfunction)   . GERD (gastroesophageal reflux disease)    occ tums  . Hyperlipemia   . Obesity   . Tobacco user    Past Surgical History:  Procedure Laterality Date  . CHOLECYSTECTOMY  2009  . FOREIGN BODY REMOVAL Right 09/07/2012   Procedure: REMOVAL FOREIGN BODY EXTREMITY;  Surgeon: Tami RibasKevin R Kuzma, MD;  [facility];  Service: Orthopedics;  Laterality: Right;    reports that he has been smoking Cigarettes.  He has a 30.00 pack-year smoking history. He does not have any smokeless tobacco history on file. He reports that he drinks alcohol. He reports that he does not use drugs. family history includes Alzheimer's disease in his father; Arthritis in his other; Cancer in his mother; Diabetes in his father and mother; Hypertension in his father and mother. No Known Allergies   Review of Systems  Constitutional: Negative for fatigue.  Eyes: Negative for visual disturbance.  Respiratory: Negative for cough, chest tightness and shortness of breath.   Cardiovascular: Negative for chest pain,  palpitations and leg swelling.  Endocrine: Negative for polydipsia and polyuria.  Neurological: Negative for dizziness, syncope, weakness, light-headedness and headaches.       Objective:   Physical Exam  Constitutional: He is oriented to person, place, and time. He appears well-developed and well-nourished.  HENT:  Right Ear: External ear normal.  Left Ear: External ear normal.  Mouth/Throat: Oropharynx is clear and moist.  Eyes: Pupils are equal, round, and reactive to light.  Neck: Neck supple. No thyromegaly present.  Cardiovascular: Normal rate and regular rhythm.   Pulmonary/Chest: Effort normal and breath sounds normal. No respiratory distress. He has no wheezes. He has no rales.  Musculoskeletal: He exhibits no edema.  Neurological: He is alert and oriented to person, place, and time.       Assessment:     #1 hypertension. Greatly improved. Follow-up after rest left arm seated 122/80  #2 type 2 diabetes. History of poor control but improving with addition of Jardiance  #3 hx of mild hyperlipidemia.    Plan:     -Continue current medications -Flu vaccine given -Recommend follow-up in 3 months and we'll plan repeat A1c then after he's been on Jardiance for a full 3 months -continue with weight loss efforts. - we need to consider statin with hx type 2 diabetes.  He is reluctant at this time with multiple recent med changes.  Discuss at follow up.  Kristian CoveyBruce W Angus Amini MD [facility]

## 2016-06-01 NOTE — Patient Instructions (Signed)
Continue with weight loss efforts  Let's plan on follow up in about 3 months.

## 2016-06-01 NOTE — Progress Notes (Signed)
Pre visit review using our clinic review tool, if applicable. No additional management support is needed unless otherwise documented below in the visit note. 

## 2016-07-22 ENCOUNTER — Ambulatory Visit (INDEPENDENT_AMBULATORY_CARE_PROVIDER_SITE_OTHER): Payer: 59 | Admitting: Family Medicine

## 2016-07-22 ENCOUNTER — Encounter: Payer: Self-pay | Admitting: Family Medicine

## 2016-07-22 VITALS — BP 110/80 | HR 112 | Temp 98.6°F | Ht 69.0 in | Wt 224.0 lb

## 2016-07-22 DIAGNOSIS — J0120 Acute ethmoidal sinusitis, unspecified: Secondary | ICD-10-CM | POA: Diagnosis not present

## 2016-07-22 MED ORDER — PANTOPRAZOLE SODIUM 40 MG PO TBEC: 40.0000 mg | DELAYED_RELEASE_TABLET | Freq: Every day | ORAL | 2 refills | Status: DC

## 2016-07-22 MED ORDER — AMOXICILLIN-POT CLAVULANATE 875-125 MG PO TABS: 1.0000 | ORAL_TABLET | Freq: Two times a day (BID) | ORAL | 0 refills | Status: DC

## 2016-07-22 NOTE — Progress Notes (Signed)
Pre visit review using our clinic review tool, if applicable. No additional management support is needed unless otherwise documented below in the visit note. 

## 2016-07-22 NOTE — Patient Instructions (Signed)

## 2016-07-22 NOTE — Progress Notes (Signed)
Subjective:     Patient ID: Keith Neal, male   DOB: 12/15/1955, 61 y.o.   MRN: [id number]  HPI Patient seen with over 2 week history of mostly ethmoid sinus pressure greenish nasal discharge. Directional dust a lot at work. Has had occasional headaches. Increased malaise. No fevers or chills. Occasional cough is also becoming more productive. He's tried over-the-counter medications without much improvement.  Past Medical History:  Diagnosis Date  . Arthritis    hands  . Dyslipidemia   . ED (erectile dysfunction)   . GERD (gastroesophageal reflux disease)    occ tums  . Hyperlipemia   . Obesity   . Tobacco user    Past Surgical History:  Procedure Laterality Date  . CHOLECYSTECTOMY  2009  . FOREIGN BODY REMOVAL Right 09/07/2012   Procedure: REMOVAL FOREIGN BODY EXTREMITY;  Surgeon: Tami RibasKevin R Kuzma, MD;  Location: [facility];  Service: Orthopedics;  Laterality: Right;    reports that he has been smoking Cigarettes.  He has a 30.00 pack-year smoking history. He has never used smokeless tobacco. He reports that he drinks alcohol. He reports that he does not use drugs. family history includes Alzheimer's disease in his father; Arthritis in his other; Cancer in his mother; Diabetes in his father and mother; Hypertension in his father and mother. No Known Allergies   Review of Systems  Constitutional: Positive for fatigue. Negative for chills and fever.  HENT: Positive for congestion, sinus pain and sinus pressure.   Respiratory: Positive for cough.   Neurological: Positive for headaches.       Objective:   Physical Exam  Constitutional: He appears well-developed and well-nourished.  HENT:  Right Ear: External ear normal.  Left Ear: External ear normal.  Mouth/Throat: Oropharynx is clear and moist.  Neck: Neck supple.  Cardiovascular: Normal rate and regular rhythm.   Pulmonary/Chest: Effort normal and breath sounds normal. No respiratory distress. He has no  wheezes. He has no rales.       Assessment:     Acute sinusitis probably ethmoid    Plan:     -Augmentin 875 mg twice daily for 10 days -Hydrate well -Consider Nettie pot saline irrigation especially after work because of increased dust exposure  Kristian CoveyBruce W Annelyse Rey MD [facility]

## 2016-08-31 ENCOUNTER — Encounter: Payer: Self-pay | Admitting: Family Medicine

## 2016-08-31 ENCOUNTER — Ambulatory Visit (INDEPENDENT_AMBULATORY_CARE_PROVIDER_SITE_OTHER): Payer: 59 | Admitting: Family Medicine

## 2016-08-31 DIAGNOSIS — E1165 Type 2 diabetes mellitus with hyperglycemia: Secondary | ICD-10-CM

## 2016-08-31 DIAGNOSIS — IMO0001 Reserved for inherently not codable concepts without codable children: Secondary | ICD-10-CM

## 2016-08-31 LAB — POCT GLYCOSYLATED HEMOGLOBIN (HGB A1C): HEMOGLOBIN A1C: 7.7

## 2016-08-31 NOTE — Patient Instructions (Signed)
Try to lose some weight and let's plan on follow up in 3 months. We can increase medications then if A1C not further to goal Your A1C has improved to 7.7%.

## 2016-08-31 NOTE — Progress Notes (Signed)
Pre visit review using our clinic review tool, if applicable. No additional management support is needed unless otherwise documented below in the visit note. 

## 2016-08-31 NOTE — Progress Notes (Signed)
Subjective:     Patient ID: Keith PlowmanRandy E Neal, male   DOB: June 14, 1956, 61 y.o.   MRN: [id number]  HPI Patient for follow-up type 2 diabetes. He had A1c of 10.8% last November. Is currently on regimen of Jardiance 10 mg daily and extended release metformin 750 mg once daily. He has lost 10 pounds since last visit and hopes to lose about 20 pounds more. Not monitoring blood sugars regularly. Tolerating medications without side effects. Blood pressures have been stable and improved.  Past Medical History:  Diagnosis Date  . Arthritis    hands  . Dyslipidemia   . ED (erectile dysfunction)   . GERD (gastroesophageal reflux disease)    occ tums  . Hyperlipemia   . Obesity   . Tobacco user    Past Surgical History:  Procedure Laterality Date  . CHOLECYSTECTOMY  2009  . FOREIGN BODY REMOVAL Right 09/07/2012   Procedure: REMOVAL FOREIGN BODY EXTREMITY;  Surgeon: Tami RibasKevin R Kuzma, MD;  Location: [facility];  Service: Orthopedics;  Laterality: Right;    reports that he has been smoking Cigarettes.  He has a 30.00 pack-year smoking history. He has never used smokeless tobacco. He reports that he drinks alcohol. He reports that he does not use drugs. family history includes Alzheimer's disease in his father; Arthritis in his other; Cancer in his mother; Diabetes in his father and mother; Hypertension in his father and mother. No Known Allergies   Review of Systems  Constitutional: Negative for fatigue.  Eyes: Negative for visual disturbance.  Respiratory: Negative for cough, chest tightness and shortness of breath.   Cardiovascular: Negative for chest pain, palpitations and leg swelling.  Neurological: Negative for dizziness, syncope, weakness, light-headedness and headaches.       Objective:   Physical Exam  Constitutional: He is oriented to person, place, and time. He appears well-developed and well-nourished.  HENT:  Right Ear: External ear normal.  Left Ear: External ear  normal.  Mouth/Throat: Oropharynx is clear and moist.  Eyes: Pupils are equal, round, and reactive to light.  Neck: Neck supple. No thyromegaly present.  Cardiovascular: Normal rate and regular rhythm.   Pulmonary/Chest: Effort normal and breath sounds normal. No respiratory distress. He has no wheezes. He has no rales.  Musculoskeletal: He exhibits no edema.  Neurological: He is alert and oriented to person, place, and time.       Assessment:     Type 2 diabetes improved with A1c today 7.7% compared with 10.8% back in November    Plan:     -we discussed options and gave him option of increasing dosage of current medications versus lifestyle management and additional weight loss and he prefers the latter -Establish more consistent exercise -Follow-up in 3 months and recheck A1c. If not below 7% at that point titrate medications further.  Kristian CoveyBruce W Mattalyn Anderegg MD [facility]

## 2016-09-23 DIAGNOSIS — L02612 Cutaneous abscess of left foot: Secondary | ICD-10-CM | POA: Diagnosis not present

## 2016-09-23 DIAGNOSIS — L03032 Cellulitis of left toe: Secondary | ICD-10-CM | POA: Diagnosis not present

## 2016-09-23 DIAGNOSIS — M79675 Pain in left toe(s): Secondary | ICD-10-CM | POA: Diagnosis not present

## 2016-10-11 DIAGNOSIS — L03032 Cellulitis of left toe: Secondary | ICD-10-CM | POA: Diagnosis not present

## 2016-10-26 DIAGNOSIS — L03032 Cellulitis of left toe: Secondary | ICD-10-CM | POA: Diagnosis not present

## 2016-12-22 DIAGNOSIS — H2513 Age-related nuclear cataract, bilateral: Secondary | ICD-10-CM | POA: Diagnosis not present

## 2016-12-22 DIAGNOSIS — E119 Type 2 diabetes mellitus without complications: Secondary | ICD-10-CM | POA: Diagnosis not present

## 2016-12-22 DIAGNOSIS — Z7984 Long term (current) use of oral hypoglycemic drugs: Secondary | ICD-10-CM | POA: Diagnosis not present

## 2016-12-22 LAB — HM DIABETES EYE EXAM

## 2017-01-30 ENCOUNTER — Encounter: Payer: Self-pay | Admitting: Family Medicine

## 2017-02-06 ENCOUNTER — Telehealth: Payer: Self-pay | Admitting: *Deleted

## 2017-02-06 MED ORDER — LOSARTAN POTASSIUM 50 MG PO TABS: 50.0000 mg | ORAL_TABLET | Freq: Every day | ORAL | 3 refills | Status: DC

## 2017-02-06 NOTE — Telephone Encounter (Signed)
Change to losartan 50 mg once daily

## 2017-02-06 NOTE — Telephone Encounter (Signed)
[facility] [place] valsartan (DIOVAN) 80 MG tablet Has been recalled.  Please change prescription

## 2017-02-06 NOTE — Telephone Encounter (Signed)
Left message on machine for patient to return our call.  vasartan has been changed to losartan and a refill sent.

## 2017-04-06 ENCOUNTER — Other Ambulatory Visit: Payer: Self-pay | Admitting: Family Medicine

## 2017-05-05 ENCOUNTER — Other Ambulatory Visit: Payer: Self-pay | Admitting: Family Medicine

## 2017-05-10 ENCOUNTER — Other Ambulatory Visit: Payer: Self-pay | Admitting: Family Medicine

## 2017-05-24 ENCOUNTER — Encounter: Payer: Self-pay | Admitting: Family Medicine

## 2017-05-24 ENCOUNTER — Ambulatory Visit: Payer: 59 | Admitting: Family Medicine

## 2017-05-24 VITALS — BP 130/90 | HR 87 | Temp 98.2°F | Wt 231.0 lb

## 2017-05-24 DIAGNOSIS — E1165 Type 2 diabetes mellitus with hyperglycemia: Secondary | ICD-10-CM | POA: Diagnosis not present

## 2017-05-24 DIAGNOSIS — E785 Hyperlipidemia, unspecified: Secondary | ICD-10-CM

## 2017-05-24 DIAGNOSIS — M25541 Pain in joints of right hand: Secondary | ICD-10-CM

## 2017-05-24 DIAGNOSIS — M25542 Pain in joints of left hand: Secondary | ICD-10-CM | POA: Diagnosis not present

## 2017-05-24 DIAGNOSIS — Z23 Encounter for immunization: Secondary | ICD-10-CM

## 2017-05-24 LAB — LIPID PANEL
CHOL/HDL RATIO: 4
Cholesterol: 181 mg/dL (ref 0–200)
HDL: 41.7 mg/dL (ref >39.00)
LDL Cholesterol: 118 mg/dL — ABNORMAL HIGH (ref 0–99)
NONHDL: 139.04
Triglycerides: 105 mg/dL (ref 0.0–149.0)
VLDL: 21 mg/dL (ref 0.0–40.0)

## 2017-05-24 LAB — HEPATIC FUNCTION PANEL
ALBUMIN: 4.5 g/dL (ref 3.5–5.2)
ALT: 15 U/L (ref 0–53)
AST: 12 U/L (ref 0–37)
Alkaline Phosphatase: 52 U/L (ref 39–117)
Bilirubin, Direct: 0.2 mg/dL (ref 0.0–0.3)
Total Bilirubin: 0.8 mg/dL (ref 0.2–1.2)
Total Protein: 7 g/dL (ref 6.0–8.3)

## 2017-05-24 LAB — SEDIMENTATION RATE: Sed Rate: 14 mm/hr (ref 0–20)

## 2017-05-24 LAB — HEMOGLOBIN A1C: Hgb A1c MFr Bld: 8.3 % — ABNORMAL HIGH (ref 4.6–6.5)

## 2017-05-24 MED ORDER — IBUPROFEN 800 MG PO TABS: 800.0000 mg | ORAL_TABLET | Freq: Three times a day (TID) | ORAL | 0 refills | Status: DC | PRN

## 2017-05-24 NOTE — Progress Notes (Signed)
Subjective:     Patient ID: Keith PlowmanRandy E Neal, male   DOB: 1956-04-29, 61 y.o.   MRN: [id number]  HPI Patient seen with recent onset of swelling and pain MCP joints of both hands right greater than left. He has some other body aches but predominantly pain MCP joints. He's noticed some visible swelling. His girlfriend had some 800 mg Motrin which helped. He is also had some bilateral foot pain. Denies any wrist or elbow or shoulder pain.  Denies any skin rashes. Mother has history of Wegener's granulomatosis. No known family history of rheumatoid arthritis. He has some general joint stiffness in multiple joints but this has been going on much longer than the recent hand pain.  History of type 2 diabetes. Last A1c was in March. He's on metformin. We added Jardiance but for some reason he never got this refilled and has not been taking this recently. Not monitoring blood sugars regularly. Also not had recent lipid panel.  Past Medical History:  Diagnosis Date  . Arthritis    hands  . Dyslipidemia   . ED (erectile dysfunction)   . GERD (gastroesophageal reflux disease)    occ tums  . Hyperlipemia   . Obesity   . Tobacco user    Past Surgical History:  Procedure Laterality Date  . CHOLECYSTECTOMY  2009  . FOREIGN BODY REMOVAL Right 09/07/2012   Procedure: REMOVAL FOREIGN BODY EXTREMITY;  Surgeon: Tami RibasKevin R Kuzma, MD;  Location: [facility];  Service: Orthopedics;  Laterality: Right;    reports that he has been smoking cigarettes.  He has a 30.00 pack-year smoking history. he has never used smokeless tobacco. He reports that he drinks alcohol. He reports that he does not use drugs. family history includes Alzheimer's disease in his father; Arthritis in his other; Cancer in his mother; Diabetes in his father and mother; Hypertension in his father and mother. No Known Allergies   Review of Systems  Constitutional: Negative for fatigue.  Eyes: Negative for visual disturbance.   Respiratory: Negative for cough, chest tightness and shortness of breath.   Cardiovascular: Negative for chest pain, palpitations and leg swelling.  Endocrine: Negative for polydipsia and polyuria.  Musculoskeletal: Positive for arthralgias.  Neurological: Negative for dizziness, syncope, weakness, light-headedness and headaches.       Objective:   Physical Exam  Constitutional: He is oriented to person, place, and time. He appears well-developed and well-nourished.  HENT:  Right Ear: External ear normal.  Left Ear: External ear normal.  Mouth/Throat: Oropharynx is clear and moist.  Eyes: Pupils are equal, round, and reactive to light.  Neck: Neck supple. No thyromegaly present.  Cardiovascular: Normal rate and regular rhythm.  Pulmonary/Chest: Effort normal and breath sounds normal. No respiratory distress. He has no wheezes. He has no rales.  Musculoskeletal:  He does have some mild edema MCP joints bilaterally but no erythema and no appreciable warmth.  Neurological: He is alert and oriented to person, place, and time.  Skin: No rash noted.       Assessment:     #1 relatively acute arthralgias involving MCP joints bilaterally. Rule out inflammatory arthritis such as rheumatoid  #2 type 2 diabetes with history of poor control  #3 dyslipidemia    Plan:     -We discussed the fact that he needs to be on statin. Will recheck lipids first and go from there -Recheck hemoglobin A1c -will likely need to get back on Jardiance. -Screen further labs with anti-nuclear antibody, rheumatoid  factor, CCP antibody, sedimentation rate  Kristian CoveyBruce W Irianna Gilday MD [facility]

## 2017-05-25 LAB — CYCLIC CITRUL PEPTIDE ANTIBODY, IGG: Cyclic Citrullin Peptide Ab: <16 UNITS

## 2017-05-25 LAB — RHEUMATOID FACTOR

## 2017-05-25 LAB — ANA: ANA: NEGATIVE

## 2017-05-26 ENCOUNTER — Other Ambulatory Visit: Payer: Self-pay | Admitting: Family Medicine

## 2017-05-26 ENCOUNTER — Other Ambulatory Visit: Payer: Self-pay

## 2017-05-26 MED ORDER — EMPAGLIFLOZIN 10 MG PO TABS: 10.0000 mg | ORAL_TABLET | Freq: Every day | ORAL | 11 refills | Status: DC

## 2017-05-26 MED ORDER — ATORVASTATIN CALCIUM 10 MG PO TABS: 10.0000 mg | ORAL_TABLET | Freq: Every day | ORAL | 0 refills | Status: DC

## 2017-07-06 ENCOUNTER — Other Ambulatory Visit: Payer: Self-pay | Admitting: Family Medicine

## 2017-08-25 ENCOUNTER — Other Ambulatory Visit: Payer: Self-pay | Admitting: Family Medicine

## 2017-11-18 ENCOUNTER — Other Ambulatory Visit: Payer: Self-pay | Admitting: Family Medicine

## 2017-12-05 ENCOUNTER — Ambulatory Visit: Payer: 59 | Admitting: Family Medicine

## 2017-12-05 ENCOUNTER — Encounter: Payer: Self-pay | Admitting: Family Medicine

## 2017-12-05 VITALS — BP 142/82 | HR 102 | Temp 98.2°F | Wt 224.3 lb

## 2017-12-05 DIAGNOSIS — E1165 Type 2 diabetes mellitus with hyperglycemia: Secondary | ICD-10-CM

## 2017-12-05 DIAGNOSIS — J0100 Acute maxillary sinusitis, unspecified: Secondary | ICD-10-CM

## 2017-12-05 DIAGNOSIS — I10 Essential (primary) hypertension: Secondary | ICD-10-CM

## 2017-12-05 MED ORDER — IBUPROFEN 800 MG PO TABS: 800.0000 mg | ORAL_TABLET | Freq: Three times a day (TID) | ORAL | 0 refills | Status: DC | PRN

## 2017-12-05 MED ORDER — AMOXICILLIN-POT CLAVULANATE 875-125 MG PO TABS
1.0000 | ORAL_TABLET | Freq: Two times a day (BID) | ORAL | 0 refills | Status: DC
Start: 2017-12-05 — End: 2018-02-07

## 2017-12-05 MED ORDER — EMPAGLIFLOZIN 10 MG PO TABS: 10.0000 mg | ORAL_TABLET | Freq: Every day | ORAL | 11 refills | Status: DC

## 2017-12-05 NOTE — Patient Instructions (Signed)

## 2017-12-05 NOTE — Progress Notes (Signed)
  Subjective:     Patient ID: Rennie PlowmanRandy E Abshier, male   DOB: 08/02/1955, 62 y.o.   MRN: [id number]  HPI Patient seen with upper respiratory symptoms. About a week ago developed some increased cough and now has increased sinus congestion and maxillary facial pain. Occasional headaches. Increased malaise. Greenish discolored mucus. Occasional sore throat. Has taken Tylenol and Mucinex without much improvement. No definite fever  Patient has type 2 diabetes. Overdue for follow-up. Last A1c 8.3%. We prescribed Jardiance but apparently never went on that. He does remain on metformin. Not monitoring blood sugars regularly. Appetite and weight stable  Hypertension treated with losartan. No recent chest pains.  Past Medical History:  Diagnosis Date  . Arthritis    hands  . Dyslipidemia   . ED (erectile dysfunction)   . GERD (gastroesophageal reflux disease)    occ tums  . Hyperlipemia   . Obesity   . Tobacco user    Past Surgical History:  Procedure Laterality Date  . CHOLECYSTECTOMY  2009  . FOREIGN BODY REMOVAL Right 09/07/2012   Procedure: REMOVAL FOREIGN BODY EXTREMITY;  Surgeon: Tami RibasKevin R Kuzma, MD;  Location: [facility];  Service: Orthopedics;  Laterality: Right;    reports that he has been smoking cigarettes.  He has a 30.00 pack-year smoking history. He has never used smokeless tobacco. He reports that he drinks alcohol. He reports that he does not use drugs. family history includes Alzheimer's disease in his father; Arthritis in his other; Cancer in his mother; Diabetes in his father and mother; Hypertension in his father and mother. No Known Allergies   Review of Systems  Constitutional: Positive for fatigue. Negative for chills and fever.  HENT: Positive for congestion, sinus pressure and sinus pain.   Respiratory: Positive for cough. Negative for shortness of breath.   Cardiovascular: Negative for chest pain.  Endocrine: Negative for polydipsia and polyuria.   Neurological: Positive for headaches.       Objective:   Physical Exam  Constitutional: He appears well-developed and well-nourished.  HENT:  Right Ear: External ear normal.  Left Ear: External ear normal.  Mouth/Throat: Oropharynx is clear and moist.  Neck: Normal range of motion. Neck supple.  Cardiovascular: Normal rate and regular rhythm.  Pulmonary/Chest: Effort normal and breath sounds normal. He has no wheezes. He has no rales.  Lymphadenopathy:    He has no cervical adenopathy.       Assessment:     #1 probable acute maxillary sinusitis  #2 hypertension stable and at goal  #3 type 2 diabetes with history of poor compliance and poor control    Plan:     -Start Augmentin 875 mg twice daily with food -Refill Jardiance and he is encouraged to get back on 10 mg daily along with metformin and will plan three-month follow-up and recheck A1c then -Continue losartan for hypertension -Strongly encouraged to stop smoking  Kristian CoveyBruce W Xaviera Flaten MD [facility]

## 2018-01-21 ENCOUNTER — Other Ambulatory Visit: Payer: Self-pay | Admitting: Family Medicine

## 2018-01-24 DIAGNOSIS — H2513 Age-related nuclear cataract, bilateral: Secondary | ICD-10-CM | POA: Diagnosis not present

## 2018-01-24 DIAGNOSIS — Z7984 Long term (current) use of oral hypoglycemic drugs: Secondary | ICD-10-CM | POA: Diagnosis not present

## 2018-01-24 DIAGNOSIS — E119 Type 2 diabetes mellitus without complications: Secondary | ICD-10-CM | POA: Diagnosis not present

## 2018-02-07 ENCOUNTER — Ambulatory Visit: Payer: 59 | Admitting: Family Medicine

## 2018-02-07 ENCOUNTER — Encounter: Payer: Self-pay | Admitting: Family Medicine

## 2018-02-07 VITALS — BP 130/90 | HR 82 | Temp 98.2°F | Wt 226.0 lb

## 2018-02-07 DIAGNOSIS — R1012 Left upper quadrant pain: Secondary | ICD-10-CM

## 2018-02-07 DIAGNOSIS — B079 Viral wart, unspecified: Secondary | ICD-10-CM | POA: Diagnosis not present

## 2018-02-07 DIAGNOSIS — R238 Other skin changes: Secondary | ICD-10-CM

## 2018-02-07 DIAGNOSIS — R41840 Attention and concentration deficit: Secondary | ICD-10-CM

## 2018-02-07 NOTE — Progress Notes (Signed)
Subjective:     Patient ID: Keith PlowmanRandy E Neal, male   DOB: 09-May-1956, 62 y.o.   MRN: [id number]  HPI Patient seen for several things today as follows  About a week ago he was leaning against a counter and noticed some slight soreness left upper quadrant. He thinks he might have felt some mild swelling. He was concerned he may have a hernia. No fever. No nausea or vomiting. No change in stool habits. No history of colonoscopy. Appetite and weight are stable. Job requires some light lifting. Denies any injury.  Second issue is he has pruritic spot on his right upper back. Has been scratching this frequently over the past week. No drainage.  Third issue is warty type growth left groin region. Requesting treatment. This has been present for a few months and slowly growing in size  Fourth issue is concern for possible attention deficit issues. He states he had some issues throughout life but especially recently. He has never been evaluated. His girlfriend takes Adderall and he had taken a partial dose which he thought helped. Frequently has difficulty focusing and completing tasks.  His father has dementia and he has some concerns regarding genetics/risk  of dementia  Past Medical History:  Diagnosis Date  . Arthritis    hands  . Dyslipidemia   . ED (erectile dysfunction)   . GERD (gastroesophageal reflux disease)    occ tums  . Hyperlipemia   . Obesity   . Tobacco user    Past Surgical History:  Procedure Laterality Date  . CHOLECYSTECTOMY  2009  . FOREIGN BODY REMOVAL Right 09/07/2012   Procedure: REMOVAL FOREIGN BODY EXTREMITY;  Surgeon: Tami RibasKevin R Kuzma, MD;  Location: [facility];  Service: Orthopedics;  Laterality: Right;    reports that he has been smoking cigarettes. He has a 30.00 pack-year smoking history. He has never used smokeless tobacco. He reports that he drinks alcohol. He reports that he does not use drugs. family history includes Alzheimer's disease in his  father; Arthritis in his other; Cancer in his mother; Diabetes in his father and mother; Hypertension in his father and mother. No Known Allergies   Review of Systems  Constitutional: Negative for appetite change, chills, fever and unexpected weight change.  Respiratory: Negative for shortness of breath.   Cardiovascular: Negative for chest pain.  Gastrointestinal: Positive for abdominal pain. Negative for abdominal distention, blood in stool, constipation, diarrhea, nausea and vomiting.  Psychiatric/Behavioral: Negative for confusion and dysphoric mood.       Objective:   Physical Exam  Constitutional: He appears well-developed and well-nourished.  Cardiovascular: Normal rate and regular rhythm.  Pulmonary/Chest: Effort normal and breath sounds normal.  Abdominal: Normal appearance and bowel sounds are normal.  No palpable hernia. No mass. Localized area of mild tenderness left upper quadrant. He does have some dense fatty tissue in this region but no palpated hernia. No splenomegaly. No hepatomegaly. No guarding or rebound.  Skin:  Small erythematous papule right upper back with eschar noted the center. Nonfluctuant. Nontender.  Patient has couple of classic warty-like lesions left groin with one smaller lesion is about 3 mm diameter L large one which is about 1 cm       Assessment:     #1 left upper quadrant abdominal pain. Question musculoskeletal versus symptomatic panniculitis. No mass palpated otherwise. Doubt hernia.  Nonacute exam  #2 nonspecific papule right upper back. Question recent bite vs ingrown hair. . No abscess  #3 warty lesions times  two left groin. Appears benign  #4 patient has concerns for possible attention deficit disorder. Never evaluated    Plan:     -He was given referral number for behavioral health to consider ADD assessment -Discussed risk and benefits of liquid nitrogen therapy to warty lesions left groin. We explained the recent risk of pain,  blistering, infection. He consented and these were both treated without difficulty. We recommended follow-up in 2 weeks if not resolving -Regarding abdominal pain recommend observation for now. Follow-up promptly for any fever, vomiting, swelling, or increasing pain Consider imaging if not improving in a couple weeks -Reassurance regarding right upper back lesion. This appears to possibly been some type of bite but appears more allergic and resolving  Keith CoveyBruce W Aidan Moten MD [facility]  -

## 2018-02-07 NOTE — Patient Instructions (Signed)
Touch base in 1-2 weeks if abdominal pain not resolving  Follow up sooner for any fever, increased pain, vomiting, or swelling.

## 2018-02-22 ENCOUNTER — Other Ambulatory Visit: Payer: Self-pay | Admitting: Family Medicine

## 2018-03-07 ENCOUNTER — Ambulatory Visit: Payer: Self-pay | Admitting: Family Medicine

## 2018-03-13 ENCOUNTER — Ambulatory Visit: Payer: Self-pay | Admitting: Family Medicine

## 2018-03-19 ENCOUNTER — Ambulatory Visit: Payer: 59 | Admitting: Family Medicine

## 2018-04-11 ENCOUNTER — Ambulatory Visit: Payer: Self-pay | Admitting: Family Medicine

## 2018-04-17 ENCOUNTER — Ambulatory Visit: Payer: Self-pay | Admitting: Family Medicine

## 2018-04-18 ENCOUNTER — Other Ambulatory Visit: Payer: Self-pay

## 2018-04-18 ENCOUNTER — Encounter: Payer: Self-pay | Admitting: Family Medicine

## 2018-04-18 ENCOUNTER — Ambulatory Visit: Payer: 59 | Admitting: Family Medicine

## 2018-04-18 VITALS — BP 132/88 | HR 82 | Temp 97.7°F | Wt 230.0 lb

## 2018-04-18 DIAGNOSIS — Z1211 Encounter for screening for malignant neoplasm of colon: Secondary | ICD-10-CM | POA: Diagnosis not present

## 2018-04-18 DIAGNOSIS — R109 Unspecified abdominal pain: Secondary | ICD-10-CM

## 2018-04-18 DIAGNOSIS — E1165 Type 2 diabetes mellitus with hyperglycemia: Secondary | ICD-10-CM

## 2018-04-18 DIAGNOSIS — Z23 Encounter for immunization: Secondary | ICD-10-CM

## 2018-04-18 MED ORDER — IBUPROFEN 800 MG PO TABS: 800.0000 mg | ORAL_TABLET | Freq: Three times a day (TID) | ORAL | 0 refills | Status: DC | PRN

## 2018-04-18 NOTE — Progress Notes (Signed)
  Subjective:     Patient ID: Keith Neal, male   DOB: 17-Oct-1955, 62 y.o.   MRN: [id number]  HPI Patient has chronic problems including obesity, type 2 diabetes, hyperlipidemia, hypertension, nicotine use.  He is seen with some left-sided abdominal pain.  Was seen for similar issue back in August and was felt to be probably musculoskeletal.  No hernia noted.  His pain did improve some.  He fell a few weeks ago carrying groceries into the house when his flip-flop got caught and he states he had some increased pain since then.  No change in bowel habits.  No weight change.  No appetite change.  No fevers or chills.  No dysuria.  Denies any flank pain.  Patient has never had colonoscopy.  He is willing to consider.  Type 2 diabetes.  Improved control recently.  Past Medical History:  Diagnosis Date  . Arthritis    hands  . Dyslipidemia   . ED (erectile dysfunction)   . GERD (gastroesophageal reflux disease)    occ tums  . Hyperlipemia   . Obesity   . Tobacco user    Past Surgical History:  Procedure Laterality Date  . CHOLECYSTECTOMY  2009  . FOREIGN BODY REMOVAL Right 09/07/2012   Procedure: REMOVAL FOREIGN BODY EXTREMITY;  Surgeon: Tami Ribas, MD;  Location: [facility];  Service: Orthopedics;  Laterality: Right;    reports that he has been smoking cigarettes. He has a 30.00 pack-year smoking history. He has never used smokeless tobacco. He reports that he drinks alcohol. He reports that he does not use drugs. family history includes Alzheimer's disease in his father; Arthritis in his other; Cancer in his mother; Diabetes in his father and mother; Hypertension in his father and mother. No Known Allergies   Review of Systems  Constitutional: Negative for appetite change, chills, fever and unexpected weight change.  Respiratory: Negative for shortness of breath.   Cardiovascular: Negative for chest pain.  Gastrointestinal: Positive for abdominal pain. Negative for  abdominal distention, blood in stool, constipation, diarrhea, nausea and vomiting.  Genitourinary: Negative for dysuria.       Objective:   Physical Exam  Constitutional: He appears well-developed and well-nourished.  Cardiovascular: Normal rate and regular rhythm.  Pulmonary/Chest: Effort normal and breath sounds normal.  Abdominal: Normal appearance. He exhibits no distension, no ascites, no pulsatile midline mass and no mass. There is no hepatosplenomegaly. There is no tenderness. There is no rebound and no guarding. No hernia. Hernia confirmed negative in the ventral area.       Assessment:     #1 left-sided abdominal pain.  This is several centimeters lateral to the umbilicus region.  Unremarkable exam at this time with no reproducible tenderness and no masses.  No guarding or rebound.  #2 type 2 diabetes    Plan:     -Recommend CBC and recheck A1c -Set up screening colonoscopy which he has never had -We discussed possible further imaging regarding his abdominal pain but he declines at this time and would like to give this another week or 2.  If pain not improving at that point consider CT abdomen pelvis for further evaluation  Kristian Covey MD [facility] at [facility]

## 2018-04-18 NOTE — Patient Instructions (Signed)
We will set up colonoscopy  Follow up for any fever, increased pain, stool changes, or other concerns.

## 2018-04-19 LAB — CBC WITH DIFFERENTIAL/PLATELET
BASOS PCT: 1.8 % (ref 0.0–3.0)
Basophils Absolute: 0.1 10*3/uL (ref 0.0–0.1)
EOS PCT: 2.7 % (ref 0.0–5.0)
Eosinophils Absolute: 0.2 10*3/uL (ref 0.0–0.7)
HCT: 43.8 % (ref 39.0–52.0)
HEMOGLOBIN: 15.2 g/dL (ref 13.0–17.0)
Lymphocytes Relative: 31.4 % (ref 12.0–46.0)
Lymphs Abs: 2.3 10*3/uL (ref 0.7–4.0)
MCHC: 34.8 g/dL (ref 30.0–36.0)
MCV: 93.9 fl (ref 78.0–100.0)
MONO ABS: 0.8 10*3/uL (ref 0.1–1.0)
MONOS PCT: 10.2 % (ref 3.0–12.0)
Neutro Abs: 4 10*3/uL (ref 1.4–7.7)
Neutrophils Relative %: 53.9 % (ref 43.0–77.0)
Platelets: 258 10*3/uL (ref 150.0–400.0)
RBC: 4.67 Mil/uL (ref 4.22–5.81)
RDW: 13.1 % (ref 11.5–15.5)
WBC: 7.4 10*3/uL (ref 4.0–10.5)

## 2018-04-19 LAB — HEMOGLOBIN A1C: HEMOGLOBIN A1C: 9.9 % — AB (ref 4.6–6.5)

## 2018-05-09 ENCOUNTER — Encounter: Payer: Self-pay | Admitting: Family Medicine

## 2018-05-09 ENCOUNTER — Other Ambulatory Visit: Payer: Self-pay

## 2018-05-09 ENCOUNTER — Ambulatory Visit: Payer: 59 | Admitting: Family Medicine

## 2018-05-09 VITALS — BP 140/96 | HR 90 | Temp 98.1°F | Wt 228.3 lb

## 2018-05-09 DIAGNOSIS — M25552 Pain in left hip: Secondary | ICD-10-CM | POA: Diagnosis not present

## 2018-05-09 DIAGNOSIS — E1165 Type 2 diabetes mellitus with hyperglycemia: Secondary | ICD-10-CM | POA: Diagnosis not present

## 2018-05-09 DIAGNOSIS — I10 Essential (primary) hypertension: Secondary | ICD-10-CM

## 2018-05-09 NOTE — Progress Notes (Signed)
  Subjective:     Patient ID: Keith PlowmanRandy E Milliman, male   DOB: 03-21-1956, 62 y.o.   MRN: [id number]  HPI Seen for the following issues  Recent labs with A1c 9.9%.  He takes metformin regularly and is supposed to be on Jardiance but has not been taking this.  Not monitoring blood sugars at home.  Poor compliance with diet.  No polyuria or polydipsia  Hypertension treated with losartan.  He states he is compliant with that.  No headaches.  No chest pain.  No dizziness.  Left lateral hip and buttock pain.  He had episode of playing golf and had difficulty finishing the back 9.  No specific injury.  No low back pain.  No pain with ambulation.  No rash.  Some relief with topical sports creams  Past Medical History:  Diagnosis Date  . Arthritis    hands  . Dyslipidemia   . ED (erectile dysfunction)   . GERD (gastroesophageal reflux disease)    occ tums  . Hyperlipemia   . Obesity   . Tobacco user    Past Surgical History:  Procedure Laterality Date  . CHOLECYSTECTOMY  2009  . FOREIGN BODY REMOVAL Right 09/07/2012   Procedure: REMOVAL FOREIGN BODY EXTREMITY;  Surgeon: Tami RibasKevin R Kuzma, MD;  Location: [facility];  Service: Orthopedics;  Laterality: Right;    reports that he has been smoking cigarettes. He has a 30.00 pack-year smoking history. He has never used smokeless tobacco. He reports that he drinks alcohol. He reports that he does not use drugs. family history includes Alzheimer's disease in his father; Arthritis in his other; Cancer in his mother; Diabetes in his father and mother; Hypertension in his father and mother. No Known Allergies   Review of Systems  Constitutional: Negative for fatigue.  Eyes: Negative for visual disturbance.  Respiratory: Negative for cough, chest tightness and shortness of breath.   Cardiovascular: Negative for chest pain, palpitations and leg swelling.  Endocrine: Negative for polydipsia and polyuria.  Neurological: Negative for dizziness,  syncope, weakness, light-headedness and headaches.       Objective:   Physical Exam  Constitutional: He appears well-developed and well-nourished.  Cardiovascular: Normal rate and regular rhythm.  Pulmonary/Chest: Effort normal and breath sounds normal.  Abdominal: Soft. There is no tenderness.  Musculoskeletal: He exhibits no edema.  Good range of motion left hip with no reproducible tenderness.  No lateral tenderness to palpation.       Assessment:     #1 poorly controlled type 2 diabetes.  Poor compliance with medications and diet.  #2 hypertension.  Suboptimally controlled by today's reading.  #3 left lateral hip pain.  Suspect muscular.  Does not have any tenderness over the greater trochanteric bursa.  Doubt IT band.    Plan:     -Long talk about importance of stepping up compliance with medications for his diabetes.  Get back on Jardiance daily and will recheck A1c in 3 months.  If not closer to goal at that point consider GLP-1 medication  -Strongly advised weight loss.  He is aware his blood pressure is not controlled well today.  If not better controlled on follow-up increase losartan and consider additional medications  -He will try some heat along with topical sports cream for his pain in the left buttock region.  Not improving over the next few weeks consider sports medicine referral  Kristian CoveyBruce W Chauntae Hults MD [facility]

## 2018-05-09 NOTE — Patient Instructions (Signed)
Get back on DAILY Jardiance  Lose some weight  Reduce sugars and starches.

## 2018-05-12 ENCOUNTER — Other Ambulatory Visit: Payer: Self-pay | Admitting: Family Medicine

## 2018-06-16 ENCOUNTER — Other Ambulatory Visit: Payer: Self-pay | Admitting: Family Medicine

## 2018-06-26 ENCOUNTER — Encounter: Payer: Self-pay | Admitting: Family Medicine

## 2018-07-25 ENCOUNTER — Ambulatory Visit: Payer: 59 | Admitting: Family Medicine

## 2018-08-01 ENCOUNTER — Ambulatory Visit: Payer: 59 | Admitting: Family Medicine

## 2018-08-01 ENCOUNTER — Encounter: Payer: Self-pay | Admitting: Family Medicine

## 2018-08-01 ENCOUNTER — Other Ambulatory Visit: Payer: Self-pay

## 2018-08-01 VITALS — BP 124/80 | HR 93 | Temp 98.2°F | Ht 69.0 in | Wt 224.5 lb

## 2018-08-01 DIAGNOSIS — B079 Viral wart, unspecified: Secondary | ICD-10-CM

## 2018-08-01 DIAGNOSIS — A630 Anogenital (venereal) warts: Secondary | ICD-10-CM

## 2018-08-01 DIAGNOSIS — E1165 Type 2 diabetes mellitus with hyperglycemia: Secondary | ICD-10-CM

## 2018-08-01 NOTE — Patient Instructions (Addendum)
Get back on the Jardiance and let's plan on 3 month follow up.  Let me know if condylomatous lesions not resolving over next couple of weeks.

## 2018-08-01 NOTE — Progress Notes (Signed)
  Subjective:     Patient ID: Keith Neal, male   DOB: 01/06/1956, 63 y.o.   MRN: [id number]  HPI Patient is here today requesting removal of "moles" left inguinal region.  Back in August of last year he had a warty lesion in this area that was treated with liquid nitrogen but they never resolved following that.  He thinks he has seen a couple new spots since then.  These are asymptomatic.  No prior history of condyloma lesions.  He has type 2 diabetes which has been very poorly controlled.  He is currently not taking Jardiance.  We did send in refills several months ago.  He declines A1c at this time.  Recent poor compliance with diet.  Past Medical History:  Diagnosis Date  . Arthritis    hands  . Dyslipidemia   . ED (erectile dysfunction)   . GERD (gastroesophageal reflux disease)    occ tums  . Hyperlipemia   . Obesity   . Tobacco user    Past Surgical History:  Procedure Laterality Date  . CHOLECYSTECTOMY  2009  . FOREIGN BODY REMOVAL Right 09/07/2012   Procedure: REMOVAL FOREIGN BODY EXTREMITY;  Surgeon: Tami Ribas, MD;  Location: [facility];  Service: Orthopedics;  Laterality: Right;    reports that he has been smoking cigarettes. He has a 30.00 pack-year smoking history. He has never used smokeless tobacco. He reports current alcohol use. He reports that he does not use drugs. family history includes Alzheimer's disease in his father; Arthritis in an other family member; Cancer in his mother; Diabetes in his father and mother; Hypertension in his father and mother. No Known Allergies   Review of Systems  Hematological: Negative for adenopathy.       Objective:   Physical Exam Constitutional:      Appearance: Normal appearance.  Cardiovascular:     Rate and Rhythm: Normal rate and regular rhythm.  Pulmonary:     Effort: Pulmonary effort is normal.     Breath sounds: Normal breath sounds.  Skin:    Comments: Patient has a chain of warty lesions in  the left inguinal region.  In total he has about 4 different lesions these all has classic verrucous surface.  Largest one measures about 1 cm diameter.  These all have very well demarcated border they are slightly brownish in color  Neurological:     Mental Status: He is alert.        Assessment:     #1 verrucous lesions left inguinal region.  These did not respond very well with liquid nitrogen.  #2 type 2 diabetes poorly controlled.    Plan:     -We discussed treatment trial with TCA topically.  We explained risks including skin irritation and patient consented.  -TCA was applied topically and patient will thoroughly rinse and clean this area within a few hours.  Touch base in couple weeks if these are not resolving  -Patient encouraged to get back on Jardiance as well as other regular medications and follow-up within 3 months to reassess A1c  Kristian Covey MD [facility] at [facility]

## 2018-08-13 ENCOUNTER — Other Ambulatory Visit: Payer: Self-pay | Admitting: Family Medicine

## 2018-08-13 NOTE — Telephone Encounter (Signed)
Copied from CRM [id number]. Topic: Quick Communication - Rx Refill/Question >> Aug 13, 2018  1:53 PM Jilda Roche wrote: Medication: ibuprofen (ADVIL,MOTRIN) 800 MG tablet, JARDIANCE 10 MG TABS tablet  Has the patient contacted their pharmacy? Yes.   (Agent: If no, request that the patient contact the pharmacy for the refill.) (Agent: If yes, when and what did the pharmacy advise?) Call office  Preferred Pharmacy (with phone number or street name): [id number] [place], [place] - [address] [phone] (Phone) [phone] (Fax)    Agent: Please be advised that RX refills may take up to 3 business days. We ask that you follow-up with your pharmacy.

## 2018-08-13 NOTE — Telephone Encounter (Signed)
Requested medication (s) are due for refill today -yes  Requested medication (s) are on the active medication list -yes  Future visit scheduled -no  Last refill: Jardiance- 01/22/18                  Ibuprofen- 04/18/18   Notes to clinic: Patient is requesting medication that fail lab protocol- sent for PCP review.   Requested Prescriptions  Pending Prescriptions Disp Refills   ibuprofen (ADVIL,MOTRIN) 800 MG tablet 30 tablet 0    Sig: Take 1 tablet (800 mg total) by mouth every 8 (eight) hours as needed.     Analgesics:  NSAIDS Failed - 08/13/2018  1:59 PM      Failed - Cr in normal range and within 360 days    Creatinine, Ser  Date Value Ref Range Status  12/02/2015 0.97 0.40 - 1.50 mg/dL Final         Passed - HGB in normal range and within 360 days    Hemoglobin  Date Value Ref Range Status  04/18/2018 15.2 13.0 - 17.0 g/dL Final         Passed - Patient is not pregnant      Passed - Valid encounter within last 12 months    Recent Outpatient Visits          1 week ago Uncontrolled type 2 diabetes mellitus with hyperglycemia ([place])   Therapist, music at [facility], Alinda Sierras, MD   3 months ago Uncontrolled type 2 diabetes mellitus with hyperglycemia ([place])   Therapist, music at [facility], Alinda Sierras, MD   3 months ago Abdominal pain, left lateral   Therapist, music at [facility], Alinda Sierras, MD   6 months ago Abdominal pain, LUQ   Therapist, music at [facility], Alinda Sierras, MD   8 months ago Acute maxillary sinusitis, recurrence not specified   Therapist, music at [facility], Alinda Sierras, MD            empagliflozin (JARDIANCE) 10 MG TABS tablet 30 tablet 11    Sig: Take 10 mg by mouth daily.     Endocrinology:  Diabetes - SGLT2 Inhibitors Failed - 08/13/2018  1:59 PM      Failed - Cr in normal range and within 360 days    Creatinine, Ser  Date Value Ref Range Status  12/02/2015 0.97 0.40 - 1.50 mg/dL Final          Failed - LDL in normal range and within 360 days    LDL Cholesterol  Date Value Ref Range Status  05/24/2017 118 (H) 0 - 99 mg/dL Final         Failed - HBA1C is between 0 and 7.9 and within 180 days    Hgb A1c MFr Bld  Date Value Ref Range Status  04/18/2018 9.9 (H) 4.6 - 6.5 % Final    Comment:    Glycemic Control Guidelines for People with Diabetes:Non Diabetic:  <6%Goal of Therapy: <7%Additional Action Suggested:  >8%          Failed - eGFR in normal range and within 360 days    GFR calc Af Amer  Date Value Ref Range Status  08/03/2007   Final   >60        The eGFR has been calculated using the MDRD equation. This calculation has not been validated in all clinical   GFR calc non Af Amer  Date Value Ref Range Status  08/03/2007 >60  Final  GFR  Date Value Ref Range Status  12/02/2015 83.29 >60.00 mL/min Final         Passed - Valid encounter within last 6 months    Recent Outpatient Visits          1 week ago Uncontrolled type 2 diabetes mellitus with hyperglycemia ([place])   Therapist, music at [facility], Alinda Sierras, MD   3 months ago Uncontrolled type 2 diabetes mellitus with hyperglycemia ([place])   Therapist, music at [facility], Alinda Sierras, MD   3 months ago Abdominal pain, left lateral   Therapist, music at [facility], Alinda Sierras, MD   6 months ago Abdominal pain, LUQ   Therapist, music at [facility], Alinda Sierras, MD   8 months ago Acute maxillary sinusitis, recurrence not specified   Therapist, music at [facility], Alinda Sierras, MD              Requested Prescriptions  Pending Prescriptions Disp Refills   ibuprofen (ADVIL,MOTRIN) 800 MG tablet 30 tablet 0    Sig: Take 1 tablet (800 mg total) by mouth every 8 (eight) hours as needed.     Analgesics:  NSAIDS Failed - 08/13/2018  1:59 PM      Failed - Cr in normal range and within 360 days    Creatinine, Ser  Date Value Ref Range Status  12/02/2015 0.97 0.40 -  1.50 mg/dL Final         Passed - HGB in normal range and within 360 days    Hemoglobin  Date Value Ref Range Status  04/18/2018 15.2 13.0 - 17.0 g/dL Final         Passed - Patient is not pregnant      Passed - Valid encounter within last 12 months    Recent Outpatient Visits          1 week ago Uncontrolled type 2 diabetes mellitus with hyperglycemia ([place])   Therapist, music at [facility], Alinda Sierras, MD   3 months ago Uncontrolled type 2 diabetes mellitus with hyperglycemia ([place])   Therapist, music at [facility], Alinda Sierras, MD   3 months ago Abdominal pain, left lateral   Therapist, music at [facility], Alinda Sierras, MD   6 months ago Abdominal pain, LUQ   Therapist, music at [facility], Alinda Sierras, MD   8 months ago Acute maxillary sinusitis, recurrence not specified   Therapist, music at [facility], Alinda Sierras, MD            empagliflozin (JARDIANCE) 10 MG TABS tablet 30 tablet 11    Sig: Take 10 mg by mouth daily.     Endocrinology:  Diabetes - SGLT2 Inhibitors Failed - 08/13/2018  1:59 PM      Failed - Cr in normal range and within 360 days    Creatinine, Ser  Date Value Ref Range Status  12/02/2015 0.97 0.40 - 1.50 mg/dL Final         Failed - LDL in normal range and within 360 days    LDL Cholesterol  Date Value Ref Range Status  05/24/2017 118 (H) 0 - 99 mg/dL Final         Failed - HBA1C is between 0 and 7.9 and within 180 days    Hgb A1c MFr Bld  Date Value Ref Range Status  04/18/2018 9.9 (H) 4.6 - 6.5 % Final    Comment:    Glycemic Control Guidelines for People with Diabetes:Non Diabetic:  <6%Goal of  Therapy: <7%Additional Action Suggested:  >8%          Failed - eGFR in normal range and within 360 days    GFR calc Af Amer  Date Value Ref Range Status  08/03/2007   Final   >60        The eGFR has been calculated using the MDRD equation. This calculation has not been validated in all clinical    GFR calc non Af Amer  Date Value Ref Range Status  08/03/2007 >60  Final   GFR  Date Value Ref Range Status  12/02/2015 83.29 >60.00 mL/min Final         Passed - Valid encounter within last 6 months    Recent Outpatient Visits          1 week ago Uncontrolled type 2 diabetes mellitus with hyperglycemia ([place])   Therapist, music at [facility], Alinda Sierras, MD   3 months ago Uncontrolled type 2 diabetes mellitus with hyperglycemia ([place])   Therapist, music at [facility], Alinda Sierras, MD   3 months ago Abdominal pain, left lateral   Therapist, music at [facility], Alinda Sierras, MD   6 months ago Abdominal pain, LUQ   Therapist, music at [facility], Alinda Sierras, MD   8 months ago Acute maxillary sinusitis, recurrence not specified   Therapist, music at [facility], Alinda Sierras, MD

## 2018-08-14 MED ORDER — IBUPROFEN 800 MG PO TABS: 800.0000 mg | ORAL_TABLET | Freq: Three times a day (TID) | ORAL | 0 refills | Status: AC | PRN

## 2018-08-14 MED ORDER — EMPAGLIFLOZIN 10 MG PO TABS: 10.0000 mg | ORAL_TABLET | Freq: Every day | ORAL | 3 refills | Status: AC

## 2018-08-14 NOTE — Telephone Encounter (Signed)
Last OV 08/01/18, No future OV  Last filled 04/18/18, # 30 with 0 refills  OK to continue Ibuprofen?

## 2018-08-14 NOTE — Telephone Encounter (Signed)
Refill once OK. 

## 2018-08-21 ENCOUNTER — Telehealth: Payer: Self-pay | Admitting: Family Medicine

## 2018-08-21 ENCOUNTER — Other Ambulatory Visit: Payer: Self-pay

## 2018-08-21 MED ORDER — CLOTRIMAZOLE-BETAMETHASONE 1-0.05 % EX CREA: 1.0000 "application " | TOPICAL_CREAM | Freq: Two times a day (BID) | CUTANEOUS | 0 refills | Status: AC

## 2018-08-21 NOTE — Telephone Encounter (Signed)
OK to refill

## 2018-08-21 NOTE — Telephone Encounter (Unsigned)
Copied from CRM [id number]. Topic: Quick Communication - Rx Refill/Question >> Aug 21, 2018 11:21 AM Darletta Moll L wrote: Medication: clotrimazole-betamethasone (LOTRISONE) cream (irritated the corner of mouth with a razor)  Has the patient contacted their pharmacy? Yes.   (Agent: If no, request that the patient contact the pharmacy for the refill.) (Agent: If yes, when and what did the pharmacy advise?)  Preferred Pharmacy (with phone number or street name): [id number] [place], [place] - [address] [place] [address] Phone: [phone] Fax: [phone]  Agent: Please be advised that RX refills may take up to 3 business days. We ask that you follow-up with your pharmacy.

## 2018-08-21 NOTE — Telephone Encounter (Signed)
This prescription has been sent to the pharmacy requested.

## 2018-08-21 NOTE — Telephone Encounter (Signed)
Please see message.  OK to send?

## 2018-11-05 ENCOUNTER — Telehealth: Payer: Self-pay

## 2018-11-05 ENCOUNTER — Other Ambulatory Visit: Payer: Self-pay | Admitting: Family Medicine

## 2018-11-05 NOTE — Telephone Encounter (Signed)
Called patient and left a detailed voice message to let him know that he is due for his diabetic 3 month follow up in office so we can check his hemoglobin A1c.  OK for PEC to discuss/advise/schedule patient for his diabetic 3 month follow up.  CRM Created.

## 2018-11-05 NOTE — Telephone Encounter (Signed)
Called patient and left a detailed voice message to let him know that he is due for his diabetic 3 month follow up in office so we can check his hemoglobin A1c.  OK for PEC to discuss/advise/schedule patient for his diabetic 3 month follow up.  CRM Created. 

## 2018-11-06 NOTE — Telephone Encounter (Signed)
Copied from CRM [id number]. Topic: Quick Communication - Rx Refill/Question >> Nov 06, 2018  1:41 PM Wyonia Hough E wrote: Medication: pantoprazole (PROTONIX) 40 MG tablet   Has the patient contacted their pharmacy? No   Preferred Pharmacy (with phone number or street name): [id number] - [place], [place] - [address] [phone] (Phone) [phone] (Fax)    Agent: Please be advised that RX refills may take up to 3 business days. We ask that you follow-up with your pharmacy.

## 2019-02-11 ENCOUNTER — Other Ambulatory Visit: Payer: Self-pay | Admitting: Family Medicine

## 2019-02-11 NOTE — Progress Notes (Signed)
On call physician received call 03-04-19 re: patient death. Played golf with friends on Saturday and did not answer the door when they came to pick him up Sunday morning. Found in his bed. Will notify PCP. Briscoe Deutscher, DO

## 2019-02-19 DEATH — deceased

## 2019-03-20 ENCOUNTER — Telehealth: Payer: Self-pay | Admitting: Family Medicine

## 2019-03-20 NOTE — Telephone Encounter (Signed)
Pts son Hernandez Losasso) came in and brought in 18 Statement Proofs of Death form to be completed by the provider.  Pt would like to have a call at [phone] to pick the form up.  Form placed in the providers folder for completion.

## 2019-03-25 NOTE — Telephone Encounter (Signed)
Done

## 2019-03-25 NOTE — Telephone Encounter (Signed)
Form picked up from folder and given to provider to complete  

## 2019-03-26 NOTE — Telephone Encounter (Signed)
Forms have been placed up front for pick up. Patient is aware.
